# Patient Record
Sex: Female | Born: 1995 | Race: Black or African American | Hispanic: No | Marital: Single | Smoking: Current every day smoker
Health system: Southern US, Community
[De-identification: ages and names within clinical notes are randomized; demographics above are authoritative.]

## PROBLEM LIST (undated history)

## (undated) ENCOUNTER — Inpatient Hospital Stay (HOSPITAL_COMMUNITY): Payer: Self-pay

## (undated) DIAGNOSIS — A609 Anogenital herpesviral infection, unspecified: Secondary | ICD-10-CM

## (undated) DIAGNOSIS — J45909 Unspecified asthma, uncomplicated: Secondary | ICD-10-CM

## (undated) HISTORY — PX: WISDOM TOOTH EXTRACTION: SHX21

## (undated) HISTORY — DX: Anogenital herpesviral infection, unspecified: A60.9

---
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
## 2013-01-13 ENCOUNTER — Ambulatory Visit: Payer: Medicaid Other | Admitting: Family Medicine

## 2013-01-22 ENCOUNTER — Encounter: Payer: Self-pay | Admitting: *Deleted

## 2014-06-27 ENCOUNTER — Encounter (HOSPITAL_COMMUNITY): Payer: Self-pay

## 2014-08-26 NOTE — L&D Delivery Note (Cosign Needed)
Delivery Note  Patient was admitted for IOL due to alloimmunization. She was induced with cytotec and foley bulb. She had cervical change laboring down, so pitocin was not started. She was given an epidural and about 30 minutes later was feeling a lot of pressure. She was dilated to 9 cm with a bulging bag, so AROM (clear fluid) was performed at 0305. Following AROM, patient was complete with a lip. She labored down and began pushing around 0400.   At 4:15 AM a viable and healthy female was delivered via  (Presentation: Right occiput anterior).  APGAR: 8, 9; weight 8 lb 2.3 oz (3695 g).   Placenta status: intact, spontaneous.  Cord:  3 vessels with the following complications: None.  Cord pH: N/A  Anesthesia: Epidural  Episiotomy:  N/A Lacerations: Superficial left labial tear Suture Repair: N/A Est. Blood Loss (mL):  250  Mom to postpartum.  Baby to Couplet care / Skin to Skin.  Jamelle HaringHillary M Fitzgerald, MD [facility], PGY-1 06/29/2015, 4:59 AM   Patient is a G3P1011 at 2170w2d who was admitted for IOL due to alloimmunization, significant hx of anti D & C.  She progressed with augmentation via cytotec & AROM.  I was gloved and present for delivery in its entirety.  Second stage of labor progressed to SVD.  Complications: none  Lacerations: none   SHAW, KIMBERLY, CNM 7:30 AM  06/29/2015

## 2014-10-29 DIAGNOSIS — J4520 Mild intermittent asthma, uncomplicated: Secondary | ICD-10-CM

## 2014-10-29 HISTORY — DX: Mild intermittent asthma, uncomplicated: J45.20

## 2014-12-20 LAB — OB RESULTS CONSOLE GC/CHLAMYDIA
CHLAMYDIA, DNA PROBE: NEGATIVE
GC PROBE AMP, GENITAL: NEGATIVE

## 2014-12-20 LAB — OB RESULTS CONSOLE RUBELLA ANTIBODY, IGM: Rubella: IMMUNE

## 2014-12-20 LAB — OB RESULTS CONSOLE HEPATITIS B SURFACE ANTIGEN: HEP B S AG: NEGATIVE

## 2014-12-20 LAB — OB RESULTS CONSOLE HIV ANTIBODY (ROUTINE TESTING): HIV: NONREACTIVE

## 2014-12-20 LAB — OB RESULTS CONSOLE RPR: RPR: NONREACTIVE

## 2014-12-20 LAB — OB RESULTS CONSOLE ABO/RH: RH Type: NEGATIVE

## 2015-03-06 ENCOUNTER — Emergency Department (HOSPITAL_COMMUNITY): Payer: Medicaid - Out of State | Attending: Emergency Medicine

## 2015-03-06 ENCOUNTER — Encounter (HOSPITAL_COMMUNITY): Payer: Self-pay | Admitting: Obstetrics

## 2015-03-06 ENCOUNTER — Emergency Department (HOSPITAL_COMMUNITY)
Admission: EM | Admit: 2015-03-06 | Discharge: 2015-03-06 | Discharge status: Against Medical Advice | Payer: Medicaid - Out of State | Source: Home / Self Care | Attending: Emergency Medicine | Admitting: Emergency Medicine

## 2015-03-06 DIAGNOSIS — R1010 Upper abdominal pain, unspecified: Secondary | ICD-10-CM

## 2015-03-06 DIAGNOSIS — R110 Nausea: Secondary | ICD-10-CM

## 2015-03-06 DIAGNOSIS — Z79899 Other long term (current) drug therapy: Secondary | ICD-10-CM | POA: Insufficient documentation

## 2015-03-06 LAB — CBC WITH DIFFERENTIAL/PLATELET
BASOS ABS: 0 10*3/uL (ref 0.0–0.1)
BASOS PCT: 0 % (ref 0–1)
EOS PCT: 2 % (ref 0–5)
Eosinophils Absolute: 0.2 10*3/uL (ref 0.0–0.7)
HEMATOCRIT: 35.3 % — AB (ref 36.0–46.0)
Hemoglobin: 11.9 g/dL — ABNORMAL LOW (ref 12.0–15.0)
LYMPHS PCT: 13 % (ref 12–46)
Lymphs Abs: 1.6 10*3/uL (ref 0.7–4.0)
MCH: 31 pg (ref 26.0–34.0)
MCHC: 33.7 g/dL (ref 30.0–36.0)
MCV: 91.9 fL (ref 78.0–100.0)
Monocytes Absolute: 0.7 10*3/uL (ref 0.1–1.0)
Monocytes Relative: 6 % (ref 3–12)
NEUTROS ABS: 9.8 10*3/uL — AB (ref 1.7–7.7)
NEUTROS PCT: 79 % — AB (ref 43–77)
PLATELETS: 282 10*3/uL (ref 150–400)
RBC: 3.84 MIL/uL — AB (ref 3.87–5.11)
RDW: 13.4 % (ref 11.5–15.5)
WBC: 12.4 10*3/uL — AB (ref 4.0–10.5)

## 2015-03-06 LAB — URINALYSIS, ROUTINE W REFLEX MICROSCOPIC
Bilirubin Urine: NEGATIVE
Glucose, UA: NEGATIVE mg/dL
HGB URINE DIPSTICK: NEGATIVE
Ketones, ur: NEGATIVE mg/dL
LEUKOCYTES UA: NEGATIVE
NITRITE: NEGATIVE
PH: 6 (ref 5.0–8.0)
Protein, ur: NEGATIVE mg/dL
Specific Gravity, Urine: >1.03 — ABNORMAL HIGH (ref 1.005–1.030)
Urobilinogen, UA: 1 mg/dL (ref 0.0–1.0)

## 2015-03-06 LAB — COMPREHENSIVE METABOLIC PANEL
ALBUMIN: 3.3 g/dL — AB (ref 3.5–5.0)
ALT: 10 U/L — ABNORMAL LOW (ref 14–54)
ANION GAP: 6 (ref 5–15)
AST: 12 U/L — ABNORMAL LOW (ref 15–41)
Alkaline Phosphatase: 77 U/L (ref 38–126)
BUN: 7 mg/dL (ref 6–20)
CHLORIDE: 106 mmol/L (ref 101–111)
CO2: 25 mmol/L (ref 22–32)
Calcium: 8.7 mg/dL — ABNORMAL LOW (ref 8.9–10.3)
Creatinine, Ser: 0.59 mg/dL (ref 0.44–1.00)
Glucose, Bld: 89 mg/dL (ref 65–99)
POTASSIUM: 3.9 mmol/L (ref 3.5–5.1)
Sodium: 137 mmol/L (ref 135–145)
TOTAL PROTEIN: 6.6 g/dL (ref 6.5–8.1)
Total Bilirubin: 0.4 mg/dL (ref 0.3–1.2)

## 2015-03-06 LAB — LIPASE, BLOOD: LIPASE: 12 U/L — AB (ref 22–51)

## 2015-03-06 NOTE — Progress Notes (Signed)
OB rapid response RN receives call from [facility] with patient complaining of stabbing abdominal pain. RN assessing in ED states that pain does not seem to be related to uterus, states it seems to be mid upper abdominal pain. No vaginal bleeding or leaking of fluid. Feeling fetal movements. FHTs noted to be in 140s. EDD 07/04/15, with IUP at 22.6 weeks. Dr. Gladis RiffleAnywanu called with description of situation and FHTs noted and no further fetal monitoring needed. No additional orders by Dr. Gladis RiffleAnywanu at this time.

## 2015-03-06 NOTE — ED Provider Notes (Signed)
CSN: [id number]     Arrival date & time 03/06/15  1346 History   First MD Initiated Contact with Patient 03/06/15 1355     Chief Complaint  Patient presents with  . Abdominal Pain     HPI  Pt was seen at 1345.  Per pt, c/o gradual onset and resolution of one episode of upper abd "pain" that began PTA.  Was associated with nausea.  Describes the abd pain as "cramping."  Pt states she was in the ICU visiting her family, "finally got a chance to eat" and "just started to eat" when the discomfort occurred. Pt states she is starting to feel better since arrival to the ED. Denies vomiting/diarrhea, no fevers, no back pain, no rash, no CP/SOB, no black or blood in stools, no vaginal bleeding/discharge, no pelvic pain, no dysuria/hematuria. Pt EDC 22 6/7 weeks. States she had a "normal US" on 02/19/15 at her OB/GYN MD's office.    [facility] [place]. No past medical history on file.   No past surgical history on file.  History  Substance Use Topics  . Smoking status: Never Smoker   . Smokeless tobacco: Not on file  . Alcohol Use: No   OB History    Gravida Para Term Preterm AB TAB SAB Ectopic Multiple Living   1              Review of Systems ROS: Statement: All systems negative except as marked or noted in the HPI; Constitutional: Negative for fever and chills. ; ; Eyes: Negative for eye pain, redness and discharge. ; ; ENMT: Negative for ear pain, hoarseness, nasal congestion, sinus pressure and sore throat. ; ; Cardiovascular: Negative for chest pain, palpitations, diaphoresis, dyspnea and peripheral edema. ; ; Respiratory: Negative for cough, wheezing and stridor. ; ; Gastrointestinal: +nausea, upper abd pain. Negative for vomiting, diarrhea, blood in stool, hematemesis, jaundice and rectal bleeding. . ; ; Genitourinary: Negative for dysuria, flank pain and hematuria. ; ; GYN:  No vaginal bleeding, no vaginal discharge, no vulvar pain. ;; Musculoskeletal: Negative for back pain and  neck pain. Negative for swelling and trauma.; ; Skin: Negative for pruritus, rash, abrasions, blisters, bruising and skin lesion.; ; Neuro: Negative for headache, lightheadedness and neck stiffness. Negative for weakness, altered level of consciousness , altered mental status, extremity weakness, paresthesias, involuntary movement, seizure and syncope.      Allergies  Review of patient's allergies indicates no known allergies.  Home Medications   Prior to Admission medications   Medication Sig Start Date End Date Taking? Authorizing Provider  Prenatal Vit-Fe Fumarate-FA (PRENATAL MULTIVITAMIN) TABS tablet Take 1 tablet by mouth daily at 12 noon.   Yes Historical Provider, MD   There were no vitals taken for this visit. Physical Exam 1350: Physical examination:  Nursing notes reviewed; Vital signs and O2 SAT reviewed;  Constitutional: Well developed, Well nourished, Well hydrated, In no acute distress; Head:  Normocephalic, atraumatic; Eyes: EOMI, PERRL, No scleral icterus; ENMT: Mouth and pharynx normal, Mucous membranes moist; Neck: Supple, Full range of motion, No lymphadenopathy; Cardiovascular: Regular rate and rhythm, No gallop; Respiratory: Breath sounds clear & equal bilaterally, No wheezes.  Speaking full sentences with ease, Normal respiratory effort/excursion; Chest: Nontender, Movement normal; Abdomen: Soft, Nontender, Nondistended, Normal bowel sounds. +gravid.; Genitourinary: No CVA tenderness; Extremities: Pulses normal, No tenderness, No edema, No calf edema or asymmetry.; Neuro: AA&Ox3, Major CN grossly intact.  Speech clear. No gross focal motor or sensory deficits in extremities.;  Skin: Color normal, Warm, Dry.   ED Course  Procedures     EKG Interpretation None      MDM  MDM Reviewed: nursing note and vitals Interpretation: labs and ultrasound   Results for orders placed or performed during the hospital encounter of 03/06/15  Comprehensive metabolic panel   Result Value Ref Range   Sodium 137 135 - 145 mmol/L   Potassium 3.9 3.5 - 5.1 mmol/L   Chloride 106 101 - 111 mmol/L   CO2 25 22 - 32 mmol/L   Glucose, Bld 89 65 - 99 mg/dL   BUN 7 6 - 20 mg/dL   Creatinine, Ser 1.19 0.44 - 1.00 mg/dL   Calcium 8.7 (L) 8.9 - 10.3 mg/dL   Total Protein 6.6 6.5 - 8.1 g/dL   Albumin 3.3 (L) 3.5 - 5.0 g/dL   AST 12 (L) 15 - 41 U/L   ALT 10 (L) 14 - 54 U/L   Alkaline Phosphatase 77 38 - 126 U/L   Total Bilirubin 0.4 0.3 - 1.2 mg/dL   GFR calc non Af Amer >60 >60 mL/min   GFR calc Af Amer >60 >60 mL/min   Anion gap 6 5 - 15  Lipase, blood  Result Value Ref Range   Lipase 12 (L) 22 - 51 U/L  CBC with Differential  Result Value Ref Range   WBC 12.4 (H) 4.0 - 10.5 K/uL   RBC 3.84 (L) 3.87 - 5.11 MIL/uL   Hemoglobin 11.9 (L) 12.0 - 15.0 g/dL   HCT 14.7 (L) 82.9 - 56.2 %   MCV 91.9 78.0 - 100.0 fL   MCH 31.0 26.0 - 34.0 pg   MCHC 33.7 30.0 - 36.0 g/dL   RDW 13.0 86.5 - 78.4 %   Platelets 282 150 - 400 K/uL   Neutrophils Relative % 79 (H) 43 - 77 %   Neutro Abs 9.8 (H) 1.7 - 7.7 K/uL   Lymphocytes Relative 13 12 - 46 %   Lymphs Abs 1.6 0.7 - 4.0 K/uL   Monocytes Relative 6 3 - 12 %   Monocytes Absolute 0.7 0.1 - 1.0 K/uL   Eosinophils Relative 2 0 - 5 %   Eosinophils Absolute 0.2 0.0 - 0.7 K/uL   Basophils Relative 0 0 - 1 %   Basophils Absolute 0.0 0.0 - 0.1 K/uL   US Abdomen Complete 03/06/2015   CLINICAL DATA:  Upper abdominal pain.  Five months pregnant.  EXAM: ULTRASOUND ABDOMEN COMPLETE  COMPARISON:  None.  FINDINGS: Gallbladder: No gallstones or wall thickening visualized. No sonographic Murphy sign noted.  Common bile duct: Diameter: 2.2 mm, normal.  Liver: No focal lesion identified. Within normal limits in parenchymal echogenicity.  IVC: No abnormality visualized.  Pancreas: Head and body are normal.  Tail is obscured by bowel.  Spleen: Size and appearance within normal limits.  9.1 cm in length.  Right Kidney: Length: 11.2 cm.  Echogenicity within normal limits. No mass or hydronephrosis visualized.  Left Kidney: Length: 12.3 cm. Echogenicity within normal limits. No mass or hydronephrosis visualized.  Abdominal aorta: No aneurysm visualized.  1.6 cm maximal diameter.  Other findings: None.  IMPRESSION: Normal exam.   Electronically Signed   By: Francene Boyers M.D.   On: 03/06/2015 14:35    1350:  Pt immediately placed on toco/FHM on arrival to the ED. [facility] has viewed the strips:  FHR 140's, toco without contractions. Pt very certain her abd pain was located in her upper abd, not  her fundus/pelvis/etc area. Workup ordered (UA, labs, [place]). Pt agreeable. Currently denies complaints.  1545:  Pt walked out of the ED cursing and yelling "no one has even been in my room the whole time I was here!"    Samuel Jester, DO 03/09/15 1228

## 2015-03-06 NOTE — ED Notes (Signed)
Pt seen leaving department by staff.

## 2015-03-08 LAB — URINE CULTURE

## 2015-04-11 LAB — OB RESULTS CONSOLE PLATELET COUNT: PLATELETS: 276 10*3/uL

## 2015-04-11 LAB — OB RESULTS CONSOLE HGB/HCT, BLOOD
HCT: 33 %
HEMOGLOBIN: 11.1 g/dL

## 2015-04-11 LAB — OB RESULTS CONSOLE ANTIBODY SCREEN: Antibody Screen: POSITIVE

## 2015-06-26 ENCOUNTER — Encounter (HOSPITAL_COMMUNITY): Payer: Self-pay

## 2015-06-26 ENCOUNTER — Inpatient Hospital Stay (HOSPITAL_COMMUNITY)
Admission: AD | Admit: 2015-06-26 | Discharge: 2015-06-26 | Disposition: A | Discharge status: Home / Self Care | Payer: Medicaid Other | Source: Ambulatory Visit | Attending: Family Medicine | Admitting: Family Medicine

## 2015-06-26 LAB — URINALYSIS, ROUTINE W REFLEX MICROSCOPIC
BILIRUBIN URINE: NEGATIVE
Glucose, UA: NEGATIVE mg/dL
HGB URINE DIPSTICK: NEGATIVE
Ketones, ur: NEGATIVE mg/dL
Nitrite: NEGATIVE
PROTEIN: NEGATIVE mg/dL
Specific Gravity, Urine: 1.01 (ref 1.005–1.030)
UROBILINOGEN UA: 0.2 mg/dL (ref 0.0–1.0)
pH: 6.5 (ref 5.0–8.0)

## 2015-06-26 LAB — URINE MICROSCOPIC-ADD ON

## 2015-06-26 NOTE — MAU Note (Signed)
Pt c/o pain in belly button started 2 weeks ago that shoots down into vagina-cannot tell if it is a contraction. Denies vag bleeding or LOF. +FM. Got PNC in [place].

## 2015-06-27 ENCOUNTER — Other Ambulatory Visit: Payer: Self-pay | Admitting: *Deleted

## 2015-06-27 ENCOUNTER — Encounter: Payer: Self-pay | Admitting: *Deleted

## 2015-06-27 ENCOUNTER — Telehealth: Payer: Self-pay | Admitting: *Deleted

## 2015-06-27 DIAGNOSIS — O360190 Maternal care for anti-D [Rh] antibodies, unspecified trimester, not applicable or unspecified: Secondary | ICD-10-CM

## 2015-06-27 DIAGNOSIS — O0990 Supervision of high risk pregnancy, unspecified, unspecified trimester: Secondary | ICD-10-CM | POA: Insufficient documentation

## 2015-06-27 HISTORY — DX: Supervision of high risk pregnancy, unspecified, unspecified trimester: O09.90

## 2015-06-27 NOTE — Telephone Encounter (Signed)
Attempted to contact patient via phone.  Patient's mother answered stated patient's phone isn't on right now.  Explained I needed to speak to patient about her appointment for tomorrow.  Asked mother if she could have the patient call me back before 5 pm today.  Mother states that she can do that.  I gave her my direct office number so patient can call me directly.    Patient needs to be given appointment time for Ultrasound tomorrow 06/28/15 at 9:15 am in MFM.  Patient will then be induced due to gestational age of [redacted] wks and having anti-D antibody.  Clinic appointment for New OB visit for tomorrow has been cancelled.    Will await return call from patient.

## 2015-06-27 NOTE — Telephone Encounter (Signed)
Patient called after hours about a missed call made by AvalaKelly. I called her back to inform her that we needed to let her know about an appointment with MFM. Then [facility] [employer] Rassette [facility]NC[facility] spoke with her to inform her of delivery plan of action. Patient stated she understood, and was ok with the plan.

## 2015-06-28 ENCOUNTER — Encounter: Payer: Medicaid Other | Admitting: Advanced Practice Midwife

## 2015-06-28 ENCOUNTER — Encounter (HOSPITAL_COMMUNITY): Payer: Self-pay | Admitting: *Deleted

## 2015-06-28 ENCOUNTER — Inpatient Hospital Stay (HOSPITAL_COMMUNITY)
Admission: AD | Admit: 2015-06-28 | Discharge: 2015-07-01 | DRG: 775 | Disposition: A | Discharge status: Home / Self Care | Payer: Medicaid Other | Source: Ambulatory Visit | Attending: Obstetrics and Gynecology | Admitting: Obstetrics and Gynecology

## 2015-06-28 ENCOUNTER — Other Ambulatory Visit: Payer: Self-pay | Admitting: Obstetrics & Gynecology

## 2015-06-28 ENCOUNTER — Ambulatory Visit (HOSPITAL_COMMUNITY)
Admission: RE | Admit: 2015-06-28 | Discharge: 2015-06-28 | Disposition: A | Discharge status: Home / Self Care | Payer: Medicaid Other | Source: Ambulatory Visit | Attending: Obstetrics & Gynecology | Admitting: Obstetrics & Gynecology

## 2015-06-28 DIAGNOSIS — O360190 Maternal care for anti-D [Rh] antibodies, unspecified trimester, not applicable or unspecified: Secondary | ICD-10-CM | POA: Diagnosis present

## 2015-06-28 DIAGNOSIS — O360990 Maternal care for other rhesus isoimmunization, unspecified trimester, not applicable or unspecified: Secondary | ICD-10-CM | POA: Diagnosis present

## 2015-06-28 DIAGNOSIS — O360130 Maternal care for anti-D [Rh] antibodies, third trimester, not applicable or unspecified: Secondary | ICD-10-CM | POA: Diagnosis present

## 2015-06-28 DIAGNOSIS — Z6791 Unspecified blood type, Rh negative: Secondary | ICD-10-CM

## 2015-06-28 DIAGNOSIS — Z833 Family history of diabetes mellitus: Secondary | ICD-10-CM

## 2015-06-28 DIAGNOSIS — O360930 Maternal care for other rhesus isoimmunization, third trimester, not applicable or unspecified: Secondary | ICD-10-CM | POA: Diagnosis present

## 2015-06-28 DIAGNOSIS — Z3A39 39 weeks gestation of pregnancy: Secondary | ICD-10-CM | POA: Diagnosis not present

## 2015-06-28 DIAGNOSIS — O0990 Supervision of high risk pregnancy, unspecified, unspecified trimester: Secondary | ICD-10-CM

## 2015-06-28 HISTORY — DX: Unspecified blood type, rh negative: Z67.91

## 2015-06-28 LAB — RAPID URINE DRUG SCREEN, HOSP PERFORMED
Amphetamines: NOT DETECTED
Barbiturates: NOT DETECTED
Benzodiazepines: NOT DETECTED
Cocaine: NOT DETECTED
OPIATES: NOT DETECTED
Tetrahydrocannabinol: NOT DETECTED

## 2015-06-28 LAB — CBC
HCT: 33.4 % — ABNORMAL LOW (ref 36.0–46.0)
Hemoglobin: 11.4 g/dL — ABNORMAL LOW (ref 12.0–15.0)
MCH: 31.8 pg (ref 26.0–34.0)
MCHC: 34.1 g/dL (ref 30.0–36.0)
MCV: 93 fL (ref 78.0–100.0)
PLATELETS: 219 10*3/uL (ref 150–400)
RBC: 3.59 MIL/uL — AB (ref 3.87–5.11)
RDW: 14.3 % (ref 11.5–15.5)
WBC: 12.5 10*3/uL — ABNORMAL HIGH (ref 4.0–10.5)

## 2015-06-28 LAB — OB RESULTS CONSOLE GBS: STREP GROUP B AG: NEGATIVE

## 2015-06-28 LAB — GROUP B STREP BY PCR: GROUP B STREP BY PCR: NEGATIVE

## 2015-06-28 MED ORDER — ONDANSETRON HCL 4 MG/2ML IJ SOLN: 4.0000 mg | Freq: Four times a day (QID) | INTRAMUSCULAR | Status: DC | PRN

## 2015-06-28 MED ORDER — OXYCODONE-ACETAMINOPHEN 5-325 MG PO TABS: 1.0000 | ORAL_TABLET | ORAL | Status: DC | PRN

## 2015-06-28 MED ORDER — CITRIC ACID-SODIUM CITRATE 334-500 MG/5ML PO SOLN: 30.0000 mL | ORAL | Status: DC | PRN

## 2015-06-28 MED ORDER — TERBUTALINE SULFATE 1 MG/ML IJ SOLN
0.2500 mg | Freq: Once | INTRAMUSCULAR | Status: DC | PRN
  Filled 2015-06-28: qty 1

## 2015-06-28 MED ORDER — OXYTOCIN BOLUS FROM INFUSION: 500.0000 mL | INTRAVENOUS | Status: DC

## 2015-06-28 MED ORDER — LACTATED RINGERS IV SOLN: 500.0000 mL | INTRAVENOUS | Status: DC | PRN

## 2015-06-28 MED ORDER — FENTANYL 2.5 MCG/ML BUPIVACAINE 1/10 % EPIDURAL INFUSION (WH - ANES)
14.0000 mL/h | INTRAMUSCULAR | Status: DC | PRN
  Administered 2015-06-29: 14 mL/h via EPIDURAL
  Filled 2015-06-28: qty 125

## 2015-06-28 MED ORDER — EPHEDRINE 5 MG/ML INJ
10.0000 mg | INTRAVENOUS | Status: DC | PRN
  Filled 2015-06-28: qty 2

## 2015-06-28 MED ORDER — ACETAMINOPHEN 325 MG PO TABS
650.0000 mg | ORAL_TABLET | ORAL | Status: DC | PRN
  Filled 2015-06-28: qty 2

## 2015-06-28 MED ORDER — LIDOCAINE HCL (PF) 1 % IJ SOLN
30.0000 mL | INTRAMUSCULAR | Status: DC | PRN
  Filled 2015-06-28: qty 30

## 2015-06-28 MED ORDER — FENTANYL CITRATE (PF) 100 MCG/2ML IJ SOLN
100.0000 ug | INTRAMUSCULAR | Status: DC | PRN
  Administered 2015-06-28 (×2): 100 ug via INTRAVENOUS
  Filled 2015-06-28 (×2): qty 2

## 2015-06-28 MED ORDER — LACTATED RINGERS IV SOLN
INTRAVENOUS | Status: DC
  Administered 2015-06-28 – 2015-06-29 (×4): via INTRAVENOUS

## 2015-06-28 MED ORDER — OXYCODONE-ACETAMINOPHEN 5-325 MG PO TABS: 2.0000 | ORAL_TABLET | ORAL | Status: DC | PRN

## 2015-06-28 MED ORDER — MISOPROSTOL 25 MCG QUARTER TABLET
25.0000 ug | ORAL_TABLET | ORAL | Status: DC | PRN
  Administered 2015-06-28 (×2): 25 ug via VAGINAL
  Filled 2015-06-28: qty 0.25
  Filled 2015-06-28: qty 1
  Filled 2015-06-28: qty 0.25

## 2015-06-28 MED ORDER — DIPHENHYDRAMINE HCL 50 MG/ML IJ SOLN
12.5000 mg | INTRAMUSCULAR | Status: DC | PRN
Start: 2015-06-28 — End: 2015-06-29

## 2015-06-28 MED ORDER — PHENYLEPHRINE 40 MCG/ML (10ML) SYRINGE FOR IV PUSH (FOR BLOOD PRESSURE SUPPORT)
80.0000 ug | PREFILLED_SYRINGE | INTRAVENOUS | Status: DC | PRN
  Filled 2015-06-28: qty 20
  Filled 2015-06-28: qty 2

## 2015-06-28 MED ORDER — OXYTOCIN 40 UNITS IN LACTATED RINGERS INFUSION - SIMPLE MED
62.5000 mL/h | INTRAVENOUS | Status: DC
  Administered 2015-06-29 (×2): 62.5 mL/h via INTRAVENOUS
  Filled 2015-06-28: qty 1000

## 2015-06-28 NOTE — Progress Notes (Signed)
Labor Progress Note  Erin Tran is a 19 y.o. G3P1011 at 5243w1d  admitted for induction of labor due to alloimmunization.  S: Patient feeling her contractions almost constantly and now requesting something for pain. She would like to try just IV medications for now but may want an epidural when she is farther along.    O:  BP 126/66 mmHg  Pulse 95  Temp(Src) 98.2 F (36.8 C) (Oral)  Resp 20  Ht 5\' 6"  (1.676 m)  Wt 95.255 kg (210 lb)  BMI 33.91 kg/m2  LMP 10/04/2014    FHT:  FHR: 130 bpm, variability: moderate,  accelerations:  Present,  decelerations:  Absent UC:   regular, every 1-2 minutes SVE:   Dilation: 4 Effacement (%): 40, 50 Station: -2 Exam by:: Windell MomentFitzgerald, H., MD SROM/AROM: Intact   Labs: Lab Results  Component Value Date   WBC 12.5* 06/28/2015   HGB 11.4* 06/28/2015   HCT 33.4* 06/28/2015   MCV 93.0 06/28/2015   PLT 219 06/28/2015    Assessment / Plan: 19 y.o. G3P1011 3443w1d here for IOL for alloimmunization in early labor. She has made cervical change with vaginal cytotec x2.   Labor: Will allow patient to labor down given cervical change and frequency of contractions. May start pit 2x2 if progress slows.  Fetal Wellbeing:  Category I Pain Control:  Fentanyl Anticipated MOD:  NSVD  Expectant management  [name], MD [facility], PGY-1

## 2015-06-28 NOTE — Progress Notes (Signed)
Pt not in room at this time, currently walking.

## 2015-06-28 NOTE — H&P (Signed)
OBSTETRIC ADMISSION HISTORY AND PHYSICAL  Erin Tran is a 19 y.o. female G3P1011 with IUP at [redacted]w[redacted]d by 10wk presenting for IOL due to alloimmunization. She was previously induced for alloimmunization in her 2014 pregnancy. The induction lasted 36hrs. She was found to be antibody positive during her prenatal care for that pregnancy in 06/2012. At that time she was anti-D (1:64) and anti-C (1:4) positive. She has a history of TAB in 2012 but reported receiving Rhogam. She denies any blood transfusions.  She endorses fetal movement but denies contractions, vaginal bleeding, or loss of fluid. She denies headaches, blurry vision, shortness of breath, chest pain, midepigastric pain, RUQ pain, changes in urination, changes in stool, or peripheral edema.   She is unsure of whether she would like to breastfeed with this pregnancy. She breastfed her other son but said she felt he wasn't getting enough nutrition and switched to formula. She has WIC if she decides to proceed with formula feeding. She was previously on OCPs for birth control but says that they made her gain weight. She is unsure of what sort of birth control she is interested in using, but would like more information.  Dating: By 10 ---> Estimated Date of Delivery: 07/04/15  Sono:  , [redacted]w[redacted]d by LMP, gestational sac, yolk sac, and embryo visualized , [redacted]w[redacted]d by LMP, single IUP with cardiac motion , [redacted]w[redacted]d by LMP, normal anatomy, 380g - official read pending, per attending discussion was normal  Prenatal History/Complications:  Clinic  High Risk - transfer from [facility], [place]  Prenatal Labs  Dating  LMP Blood type: O/Negative/-- (04/26 0000)   Genetic Screen 1 Screen:  NT 1.7 mm  AFP:     Quad:     NIPS: Antibody:Positive (08/16 0000)  Anatomic US  wnl Rubella: Immune (04/26 0000)  GTT Early:               Third trimester: 87 RPR:     Flu vaccine  declined 12/20/14 HBsAg: Negative (04/26 0000)   TDaP vaccine                                                Rhogam:  04/11/15 HIV: Non-reactive (04/26 0000)   Baby Food                                               GBS: (For PCN allergy, check sensitivities)  Contraception  Pap:  Circumcision    Pediatrician    Support Person      Past Medical History: Past Medical History  Diagnosis Date  . Medical history non-contributory     Past Surgical History: Past Surgical History  Procedure Laterality Date  . No past surgeries      Obstetrical History: OB History    Gravida Para Term Preterm AB TAB SAB Ectopic Multiple Living   Social History: Social History   Social History  . Marital Status: Single    Spouse Name: N/A  . Number of Children: N/A  . Years of Education: N/A   Social History Main Topics  . Smoking status: Never Smoker   . Smokeless tobacco: Never Used  . Alcohol  Use: No  . Drug Use: No  . Sexual Activity: Not Currently    Birth Control/ Protection: None   Other Topics Concern  . None   Social History Narrative    Family History: Family History  Problem Relation Age of Onset  . Diabetes Maternal Grandmother     Allergies: No Known Allergies  Prescriptions prior to admission  Medication Sig Dispense Refill Last Dose  . ibuprofen (ADVIL,MOTRIN) 600 MG tablet Take 1 tablet (600 mg total) by mouth every 6 (six) hours. 30 tablet 0 Past Month at Unknown time  . Prenatal Vit-Fe Fumarate-FA (PRENATAL MULTIVITAMIN) TABS Take 1 tablet by mouth every morning.   06/28/2015 at Unknown time     Review of Systems   All systems reviewed and negative except as stated in HPI  Blood pressure 123/50, pulse 90, temperature 98.3 F (36.8 C), temperature source Oral, resp. rate 17, height 5\' 6"  (1.676 m), weight 210 lb (95.255 kg), last menstrual period 10/04/2014, unknown if currently breastfeeding. General appearance: alert, cooperative and appears stated age. Makes good eye contact.  Lungs: clear to  auscultation bilaterally Heart: regular rate and rhythm Abdomen: soft, non-tender; bowel sounds normal Pelvic: adequate Extremities: Homans sign is negative, no sign of DVT DTR's 2+, wnl Presentation: cephalic Fetal monitoringBaseline: 140 bpm, Variability: Good {> 6 bpm), Accelerations: Reactive and Decelerations: Absent Uterine activityFrequency: Every 5-7 minutes    Dilation: Fingertip Effacement (%): Thick Station: -3 Exam by:: Rolan BuccoA. Harris, RN   Prenatal labs: ABO, Rh: O/Negative/-- (04/26 0000) Antibody: Positive (08/16 0000) Rubella: !Error! RPR: Nonreactive (04/26 0000)  HBsAg: Negative (04/26 0000)  HIV: Non-reactive (04/26 0000)  GBS:    1 hr Glucola 87 Genetic screening  NT wnl Anatomy US wnl  Prenatal Transfer Tool  Maternal Diabetes: No Genetic Screening: Normal Maternal Ultrasounds/Referrals: Normal Fetal Ultrasounds or other Referrals:  None Maternal Substance Abuse:  No Significant Maternal Medications:  None Significant Maternal Lab Results: Lab values include: Rh negative- From 04/12/2015 Coomb's titer 64 (HH) and Anti D 32 (HH), Anti-C pos. Received rhogam on 12/12/2014  No results found for this or any previous visit (from the past 24 hour(s)).  Patient Active Problem List   Diagnosis Date Noted  . Rh alloimmunization, maternal, antepartum 06/28/2015  . Supervision of high risk pregnancy, antepartum 06/27/2015  . Anti-D antibodies present during pregnancy 06/27/2015   Assessment: Erin Tran is a 19 y.o. G3P1011 at 4061w1d here for IOL due to alloimmunization with Rh-D and Rh-C antibodies.  #Labor: Induction. Cytotec now and q4hrs, foley bulb when dilated enough for placement. Pitocin when favorable (Bishop>8) #FWB: Category 1, reactive #Pain: IV medications PRN.  #MOF: breast/bottle/unsure #MOC: unsure. Had lengthy discussion with patient and provided handout re: options # Circumcision: yes, outpatient due to Medicaid. Will provided list of  practices at discharge.   Isa RankinKimberly Niles [facility] 06/28/2015, 12:05 PM

## 2015-06-29 ENCOUNTER — Inpatient Hospital Stay (HOSPITAL_COMMUNITY): Payer: Medicaid Other | Admitting: Anesthesiology

## 2015-06-29 ENCOUNTER — Encounter (HOSPITAL_COMMUNITY): Payer: Self-pay | Admitting: *Deleted

## 2015-06-29 LAB — RPR: RPR: NONREACTIVE

## 2015-06-29 MED ORDER — SENNOSIDES-DOCUSATE SODIUM 8.6-50 MG PO TABS
2.0000 | ORAL_TABLET | ORAL | Status: DC
  Administered 2015-06-30 (×2): 2 via ORAL
  Filled 2015-06-29 (×2): qty 2

## 2015-06-29 MED ORDER — WITCH HAZEL-GLYCERIN EX PADS: 1.0000 "application " | MEDICATED_PAD | CUTANEOUS | Status: DC | PRN

## 2015-06-29 MED ORDER — TETANUS-DIPHTH-ACELL PERTUSSIS 5-2.5-18.5 LF-MCG/0.5 IM SUSP
0.5000 mL | Freq: Once | INTRAMUSCULAR | Status: AC
  Administered 2015-06-30: 0.5 mL via INTRAMUSCULAR
  Filled 2015-06-29: qty 0.5

## 2015-06-29 MED ORDER — ONDANSETRON HCL 4 MG/2ML IJ SOLN: 4.0000 mg | INTRAMUSCULAR | Status: DC | PRN

## 2015-06-29 MED ORDER — OXYCODONE-ACETAMINOPHEN 5-325 MG PO TABS: 1.0000 | ORAL_TABLET | ORAL | Status: DC | PRN

## 2015-06-29 MED ORDER — IBUPROFEN 600 MG PO TABS
600.0000 mg | ORAL_TABLET | Freq: Four times a day (QID) | ORAL | Status: DC
  Administered 2015-06-29 – 2015-07-01 (×9): 600 mg via ORAL
  Filled 2015-06-29 (×10): qty 1

## 2015-06-29 MED ORDER — LANOLIN HYDROUS EX OINT: TOPICAL_OINTMENT | CUTANEOUS | Status: DC | PRN

## 2015-06-29 MED ORDER — PRENATAL MULTIVITAMIN CH
1.0000 | ORAL_TABLET | Freq: Every day | ORAL | Status: DC
  Administered 2015-06-29 – 2015-06-30 (×2): 1 via ORAL
  Filled 2015-06-29 (×3): qty 1

## 2015-06-29 MED ORDER — VITAMIN K1 1 MG/0.5ML IJ SOLN
INTRAMUSCULAR | Status: AC
  Filled 2015-06-29: qty 0.5

## 2015-06-29 MED ORDER — DIPHENHYDRAMINE HCL 25 MG PO CAPS: 25.0000 mg | ORAL_CAPSULE | Freq: Four times a day (QID) | ORAL | Status: DC | PRN

## 2015-06-29 MED ORDER — SIMETHICONE 80 MG PO CHEW: 80.0000 mg | CHEWABLE_TABLET | ORAL | Status: DC | PRN

## 2015-06-29 MED ORDER — LIDOCAINE HCL (PF) 1 % IJ SOLN
INTRAMUSCULAR | Status: DC | PRN
  Administered 2015-06-29 (×2): 5 mL

## 2015-06-29 MED ORDER — ZOLPIDEM TARTRATE 5 MG PO TABS: 5.0000 mg | ORAL_TABLET | Freq: Every evening | ORAL | Status: DC | PRN

## 2015-06-29 MED ORDER — ONDANSETRON HCL 4 MG PO TABS: 4.0000 mg | ORAL_TABLET | ORAL | Status: DC | PRN

## 2015-06-29 MED ORDER — BENZOCAINE-MENTHOL 20-0.5 % EX AERO: 1.0000 "application " | INHALATION_SPRAY | CUTANEOUS | Status: DC | PRN

## 2015-06-29 MED ORDER — ACETAMINOPHEN 325 MG PO TABS
650.0000 mg | ORAL_TABLET | ORAL | Status: DC | PRN
  Administered 2015-06-29: 650 mg via ORAL

## 2015-06-29 MED ORDER — DIBUCAINE 1 % RE OINT: 1.0000 "application " | TOPICAL_OINTMENT | RECTAL | Status: DC | PRN

## 2015-06-29 MED ORDER — OXYCODONE-ACETAMINOPHEN 5-325 MG PO TABS: 2.0000 | ORAL_TABLET | ORAL | Status: DC | PRN

## 2015-06-29 MED ORDER — INFLUENZA VAC SPLIT QUAD 0.5 ML IM SUSY
0.5000 mL | PREFILLED_SYRINGE | INTRAMUSCULAR | Status: AC
  Administered 2015-06-30: 0.5 mL via INTRAMUSCULAR
  Filled 2015-06-29: qty 0.5

## 2015-06-29 NOTE — Lactation Note (Signed)
This note was copied from the chart of Erin Tran [place]. Lactation Consultation Note  Patient Name: Erin Tran Perren ZOXWR'UToday's Date: 06/29/2015 Reason for consult: Follow-up assessment (uncg STUDENT ALSO AT BS WITH LC )  BABY IS 10 HOURS OLD AND HAS BEEN TO THE BREAST SEVERAL TIMES.  1ST VISIT AT 1230 - PER MOM BABY HAD RECENTLY FED AND WAS SOUND ASLEEP.  2ND VISIT BABY MORE AWAKE AND HAD RECENTLY HAD A BATH.  LC ASSISTED MOM WITH POSITIONING AND OBTAINING DEPTH IN A FOOTBALL POSITION.  BABY SLUGGISH FEEDING PATTERN, FEW SWALLOWS NOTED , INCREASED WITH BREAST COMPRESSIONS.  BABY FED 10 MINS AND BECAME NON - NUTRITIVE SO MOM RELEASE LATCH. NIPPLE APPEARED NORMAL WHEN  BABY  RELEASED. PRIOR TO LATCH REVIEWED HAND EXPRESSING WITH MOM , SMALL DROPS OF COLOSTRUM NOTED  AND MOM REPEATED DEMO AFTER FEEDING AND USED EBM ON HER NIPPLE. BABY SKIN TO SKIN WITH MOM. Mother informed of post-discharge support and given phone number to the lactation department, including services for phone call assistance;  out-patient appointments; and breastfeeding support group. List of other breastfeeding resources in the community given in the handout.  Encouraged mother to call for problems or concerns related to breastfeeding.    Maternal Data    Feeding Feeding Type: Breast Fed Length of feed: 10 min (swalows noted , sluggish pattern )  LATCH Score/Interventions Latch: Grasps breast easily, tongue down, lips flanged, rhythmical sucking.  Audible Swallowing: A few with stimulation Intervention(s): Skin to skin;Alternate breast massage  Type of Nipple: Everted at rest and after stimulation  Comfort (Breast/Nipple): Soft / non-tender     Hold (Positioning): Assistance needed to correctly position infant at breast and maintain latch. Intervention(s): Breastfeeding basics reviewed;Support Pillows;Skin to skin  LATCH Score: 8  Lactation Tools Discussed/Used WIC Program: Yes (PER MOM )   Consult  Status Consult Status: Follow-up Date: 06/30/15 Follow-up type: In-patient    Kathrin Greathouseorio, Staria Birkhead Ann 06/29/2015, 2:32 PM

## 2015-06-29 NOTE — Anesthesia Postprocedure Evaluation (Signed)
  Anesthesia Post-op Note  Patient: Erin Tran  Procedure(s) Performed: * No procedures listed *  Patient Location: PACU and Mother/Baby  Anesthesia Type:Epidural  Level of Consciousness: awake, alert  and oriented  Airway and Oxygen Therapy: Patient Spontanous Breathing  Post-op Pain: none  Post-op Assessment: Post-op Vital signs reviewed, Patient's Cardiovascular Status Stable, No headache, No backache, No residual numbness and No residual motor weakness  Post-op Vital Signs: Reviewed and stable  Complications: No apparent anesthesia complications

## 2015-06-29 NOTE — Anesthesia Preprocedure Evaluation (Signed)

## 2015-06-29 NOTE — Anesthesia Procedure Notes (Signed)
Epidural Patient location during procedure: OB  Staffing Anesthesiologist: Kimberley Speece Performed by: anesthesiologist   Preanesthetic Checklist Completed: patient identified, site marked, surgical consent, pre-op evaluation, timeout performed, IV checked, risks and benefits discussed and monitors and equipment checked  Epidural Patient position: sitting Prep: DuraPrep Patient monitoring: heart rate, continuous pulse ox and blood pressure Approach: right paramedian Location: L3-L4 Injection technique: LOR saline  Needle:  Needle type: Tuohy  Needle gauge: 17 G Needle length: 9 cm and 9 Needle insertion depth: 6 cm Catheter type: closed end flexible Catheter size: 20 Guage Catheter at skin depth: 11 cm Test dose: negative  Assessment Events: blood not aspirated, injection not painful, no injection resistance, negative IV test and no paresthesia  Additional Notes Patient identified. Risks/Benefits/Options discussed with patient including but not limited to bleeding, infection, nerve damage, paralysis, failed block, incomplete pain control, headache, blood pressure changes, nausea, vomiting, reactions to medication both or allergic, itching and postpartum back pain. Confirmed with bedside nurse the patient's most recent platelet count. Confirmed with patient that they are not currently taking any anticoagulation, have any bleeding history or any family history of bleeding disorders. Patient expressed understanding and wished to proceed. All questions were answered. Sterile technique was used throughout the entire procedure. Please see nursing notes for vital signs. Test dose was given through epidural needle and negative prior to continuing to dose epidural or start infusion. Warning signs of high block given to the patient including shortness of breath, tingling/numbness in hands, complete motor block, or any concerning symptoms with instructions to call for help. Patient was given  instructions on fall risk and not to get out of bed. All questions and concerns addressed with instructions to call with any issues.   

## 2015-06-29 NOTE — Progress Notes (Signed)
Labor Progress Note  Erin CopasDiamon Roxan Tran is a 19 y.o. G3P1011 at 4431w2d  admitted for induction of labor due to alloimmunization.  S: Patient now with epidural. She can still feel contractions as pressure in her lower abdomen.    O:  BP 109/57 mmHg  Pulse 100  Temp(Src) 98.2 F (36.8 C) (Oral)  Resp 20  Ht 5\' 6"  (1.676 m)  Wt 95.255 kg (210 lb)  BMI 33.91 kg/m2  SpO2 97%  LMP 10/04/2014     FHT:  FHR: 130 bpm, variability: moderate,  accelerations:  Present,  decelerations:  Present Early  UC:   regular, every 2-3 minutes SVE:   Dilation: Lip/rim Effacement (%): 90 Station: -1, 0 Exam by:: Erin Tran,Erin Tran, Erin Tran AROM: clear fluid @ 0305  Labs: Lab Results  Component Value Date   WBC 12.5* 06/28/2015   HGB 11.4* 06/28/2015   HCT 33.4* 06/28/2015   MCV 93.0 06/28/2015   PLT 219 06/28/2015    Assessment / Plan: 19 y.o. G3P1011 5831w2d in active labor Induction of labor due to alloimmunization,  progressing well after cytotec x2 and laboring down.  Labor: Progressing normally Fetal Wellbeing:  Category I Pain Control:  Epidural Anticipated MOD:  NSVD  Expectant management  Erin GobbleHillary Fitzgerald, Erin Tran [facility], PGY-1

## 2015-06-29 NOTE — Progress Notes (Signed)
UR chart review completed.  

## 2015-06-30 NOTE — Progress Notes (Signed)
No Rhogam per Lenny PastelKristan Westfall ME, due to weak D status

## 2015-06-30 NOTE — Progress Notes (Signed)
Post Partum Day 1 Subjective:  Erin Tran is a 19 y.o. [id number] 2866w2d s/p IOL for alloimmunization with Rh-D and Rh-C antibodies followed by SVD. No acute events overnight.  Pt denies problems with ambulating, voiding or po intake. She denies nausea or vomiting.  Pain is well controlled.  She has had flatus.  Lochia is Minimal.  Plan for birth control is IUD.  Method of feeding is breast. She would prefer outpatient circumcision for her baby boy.  Objective: Blood pressure 115/57, pulse 60, temperature 97.9 F (36.6 C), temperature source Oral, resp. rate 18, height 5\' 6"  (1.676 m), weight 95.255 kg (210 lb), last menstrual period 10/04/2014, SpO2 100 %, unknown if currently breastfeeding.  Physical Exam:  General: alert, cooperative and no acute distress Lochia: normal flow Chest: normal WOB, clear to auscultation bilaterally Heart: regular rate and rhythm, normal S1S2 Abdomen: +BS, soft, mild TTP (appropriate) Uterine Fundus: firm, located below level of umbilicus Extremities: no pitting edema or calf tenderness  Recent Labs  06/28/15 1210  HGB 11.4*  HCT 33.4*    Assessment/Plan:  Erin Tran is a 19 y.o. [id number] 8466w2d s/p IOL for alloimmunization with Rh-D and Rh-C antibodies, post-partum day 1.  #Alloimmunization: Continue to monitor for signs/symptoms of anemia. H/H before delivery was stable. #Postpartum care: Continue routine postpartum care. History and physical exam are reassuring that patient is okay for discharge when she feels ready. #Breastfeeding: Lactation support PRN. #Contraception: Mirena IUD at 6wk postpartum visit.   LOS: 2 days   Gabriel RungKristen Westfall 06/30/2015, 7:35 AM   I have seen and examined this patient and agree the above assessment.  Respiratory effort normal, lochia appropriate, legs negative,  pain level normal. Separate DC summary written  CRESENZO-DISHMAN,[name] 07/04/2015 11:11 AM

## 2015-06-30 NOTE — Lactation Note (Signed)
This note was copied from the chart of Erin Tran. Lactation Consultation Note  Patient Name: Erin Tran [id number]'XToday's Date: 06/30/2015 Reason for consult: Follow-up assessment Mostly bf mom that reports bf is going well. Mom has supplemented because she is worried about the baby being jaundice. She thinks that if she can bf and give formula it will go away more quickly. She reports baby is latching well, she has a good supply, and has no nipple/breast pain at this time. Encouraged mom to continue bf on demand. She is aware of O/P lactation and support group. She will call as needed for bf help.   Maternal Data    Feeding Feeding Type: Breast Fed  LATCH Score/Interventions Latch: Grasps breast easily, tongue down, lips flanged, rhythmical sucking. (pulled down lips/ more br tissue in mouth)  Audible Swallowing: A few with stimulation Intervention(s): Skin to skin  Type of Nipple: Flat (pulls nipple out)  Comfort (Breast/Nipple): Soft / non-tender     Hold (Positioning): No assistance needed to correctly position infant at breast.  LATCH Score: 8  Lactation Tools Discussed/Used     Consult Status Consult Status: Follow-up Date: 07/01/15 Follow-up type: In-patient    Erin Tran 06/30/2015, 9:48 PM

## 2015-07-01 LAB — TYPE AND SCREEN
ABO/RH(D): O NEG
ANTIBODY SCREEN: POSITIVE
DAT, IgG: NEGATIVE
DONOR AG TYPE: NEGATIVE
Donor AG Type: NEGATIVE
PT AG TYPE: NEGATIVE
UNIT DIVISION: 0
Unit division: 0

## 2015-07-01 LAB — RH IG WORKUP (INCLUDES ABO/RH)
ABO/RH(D): O NEG
FETAL SCREEN: NEGATIVE
Gestational Age(Wks): 39.2
UNIT DIVISION: 0

## 2015-07-01 NOTE — Discharge Instructions (Signed)

## 2015-07-01 NOTE — Discharge Summary (Signed)
OB Discharge Summary     Patient Name: Erin Tran DOB: 09-25-1995 MRN: [id number]  Date of admission: 06/28/2015 Delivering MD: Casey Burkitt   Date of discharge: 07/01/2015  Admitting diagnosis: INDUCTION Intrauterine pregnancy: [redacted]w[redacted]d     Secondary diagnosis:  Principal Problem:   Rh alloimmunization, maternal, antepartum Active Problems:   Supervision of high risk pregnancy, antepartum   Anti-D antibodies present during pregnancy   Blood type, Rh negative  Additional problems: none     Discharge diagnosis: Term Pregnancy Delivered                                                                                                Post partum procedures:none  Augmentation: AROM and Cytotec  Complications: None  Hospital course:  Induction of Labor With Vaginal Delivery   19 y.o. yo [id number] at [redacted]w[redacted]d was admitted to the hospital 06/28/2015 for induction of labor.  Indication for induction: Alloimmunization.  Patient had an uncomplicated labor course as follows: Membrane Rupture Time/Date: 3:06 AM ,06/29/2015   Intrapartum Procedures: Episiotomy: None [1]                                         Lacerations:  None [1]  Patient had delivery of a Viable infant.  Information for the patient's newborn:  Rayli, Wiederhold [[id number]]  Delivery Method: Vag-Spont   06/29/2015  Details of delivery can be found in separate delivery note.  Patient had a routine postpartum course. Patient is discharged home 07/01/2015 11:41 AM    Physical exam  Filed Vitals:   06/29/15 1130 06/29/15 1839 06/30/15 0556 06/30/15 1817  BP: 119/50 114/58 115/57 123/64  Pulse: 86 79 60 71  Temp: 98.2 F (36.8 C) 98.3 F (36.8 C) 97.9 F (36.6 C) 98.1 F (36.7 C)  TempSrc: Oral Oral Oral Oral  Resp: Height:      Weight:      SpO2: 100%      General: alert and cooperative Lochia: appropriate Uterine Fundus: firm Incision: N/A DVT Evaluation: No evidence of DVT seen on  physical exam. Labs: Lab Results  Component Value Date   WBC 12.5* 06/28/2015   HGB 11.4* 06/28/2015   HCT 33.4* 06/28/2015   MCV 93.0 06/28/2015   PLT 219 06/28/2015   No flowsheet data found.  Discharge instruction: per After Visit Summary and "Baby and Me Booklet".  Medications:MEDICATIONS: See list below After visit meds:    Medication List    TAKE these medications        ibuprofen 600 MG tablet  Commonly known as:  ADVIL,MOTRIN  Take 1 tablet (600 mg total) by mouth every 6 (six) hours.     prenatal multivitamin Tabs tablet  Take 1 tablet by mouth every morning.        Diet: routine diet  Activity: Advance as tolerated. Pelvic rest for 6 weeks.   Outpatient follow up:6 weeks Follow up Appt:Future Appointments Date Time Provider Department Center  08/09/2015 12:45 PM Catalina AntiguaPeggy Constant, MD [facility]   Follow up Visit:No Follow-up on file.  Postpartum contraception: IUD Mirena  Newborn Data: Live born female  Birth Weight: 8 lb 2.3 oz (3695 g) APGAR: 8, 9  Baby Feeding: Breast Disposition:home with mother   07/01/2015 Cam HaiSHAW, KIMBERLY, CNM  07/01/2015

## 2015-07-01 NOTE — Lactation Note (Signed)
This note was copied from the chart of Erin Sheliah HatchDiamon Delia. Lactation Consultation Note; Mom reports baby fed about 1 hour ago and had formula afterwards. Reports breasts are feeling a little fuller this morning. Reports no pain with latch. No questions at present. To call prn  Patient Name: Erin Tran ZOXWR'UToday's Date: 07/01/2015 Reason for consult: Follow-up assessment   Maternal Data Formula Feeding for Exclusion: Yes Reason for exclusion: Mother's choice to formula and breast feed on admission  Feeding   LATCH Score/Interventions                      Lactation Tools Discussed/Used     Consult Status Consult Status: Complete    Pamelia HoitWeeks, Suda Forbess D 07/01/2015, 9:52 AM

## 2015-08-09 ENCOUNTER — Ambulatory Visit: Payer: Medicaid Other | Admitting: Obstetrics and Gynecology

## 2016-01-09 ENCOUNTER — Encounter (HOSPITAL_COMMUNITY): Payer: Self-pay | Admitting: *Deleted

## 2016-05-27 ENCOUNTER — Encounter (HOSPITAL_COMMUNITY): Payer: Self-pay | Admitting: *Deleted

## 2016-06-26 ENCOUNTER — Ambulatory Visit: Payer: Medicaid Other | Admitting: Obstetrics and Gynecology

## 2016-08-26 NOTE — L&D Delivery Note (Signed)
Patient is 21 y.o. [id number] [redacted]w[redacted]d admitted PPROM. S/p IOL with foley bulb, cytotec, followed by Pitocin. Prenatal course also complicated by minimal prenatal care and Rh negative with anti D and Antii C antibody.  Delivery Note At 11:14 AM a viable female was delivered via  (Presentation: cephalic;LOA).  APGAR: 7,9 ; weight pending .   Placenta status: intact,spontaneous .  Cord: 3 vessel    Anesthesia:  epidural Episiotomy:  none Lacerations:  none Suture Repair: n/a Est. Blood Loss (mL):  300  Mom to postpartum.  Baby to Couplet care / Skin to Skin.   Upon arrival, patient was complete. She pushed with good maternal effort to deliver a viable female infant in cephalic, LOA position over intact perineum. No nuchal cord present.Anterior shoulder delivered easily. Baby was noted to have good tone and place on maternal abdomen for oral suctioning, drying and stimulation. Delayed cord clamping performed. Placenta delivered spontaneously with gentle cord traction. Fundus firm with massage and Pitocin. Perineum inspected and found to have no laceration.. Counts of sharps, instruments, and lap pads were all correct.   Rolm Bookbinder, DO [place] [facility]

## 2017-01-29 ENCOUNTER — Ambulatory Visit (INDEPENDENT_AMBULATORY_CARE_PROVIDER_SITE_OTHER): Payer: Medicaid Other | Admitting: *Deleted

## 2017-01-29 ENCOUNTER — Encounter: Payer: Self-pay | Admitting: Obstetrics and Gynecology

## 2017-01-29 DIAGNOSIS — Z3201 Encounter for pregnancy test, result positive: Secondary | ICD-10-CM

## 2017-01-29 DIAGNOSIS — Z3200 Encounter for pregnancy test, result unknown: Secondary | ICD-10-CM

## 2017-01-29 LAB — POCT PREGNANCY, URINE: Preg Test, Ur: POSITIVE — AB

## 2017-01-29 NOTE — Progress Notes (Signed)
Pt informed of +UPT today.  LMP 11/07/16.  EDD 08/14/17.  Pt desires prenatal care in this office. She had gone to [facility], had US and was told she was 15w 2d at that time. That would make EDD 06/26/17 and today she would be 18w 6d. US for anatomy and EGA scheduled on 6/15 @ 1545.

## 2017-01-31 ENCOUNTER — Encounter (HOSPITAL_COMMUNITY): Payer: Self-pay | Admitting: Family Medicine

## 2017-02-07 ENCOUNTER — Ambulatory Visit (HOSPITAL_COMMUNITY)
Admission: RE | Admit: 2017-02-07 | Discharge: 2017-02-07 | Disposition: A | Discharge status: Home / Self Care | Payer: Medicaid Other | Source: Ambulatory Visit | Attending: Family Medicine | Admitting: Family Medicine

## 2017-02-07 ENCOUNTER — Other Ambulatory Visit: Payer: Self-pay | Admitting: Family Medicine

## 2017-02-07 DIAGNOSIS — Z3689 Encounter for other specified antenatal screening: Secondary | ICD-10-CM

## 2017-02-07 DIAGNOSIS — Z3A19 19 weeks gestation of pregnancy: Secondary | ICD-10-CM | POA: Insufficient documentation

## 2017-02-07 DIAGNOSIS — Z3200 Encounter for pregnancy test, result unknown: Secondary | ICD-10-CM

## 2017-02-07 DIAGNOSIS — Z363 Encounter for antenatal screening for malformations: Secondary | ICD-10-CM | POA: Diagnosis not present

## 2017-02-24 ENCOUNTER — Encounter: Payer: Self-pay | Admitting: Family Medicine

## 2017-02-24 NOTE — Progress Notes (Signed)
Patient did not keep her appointment today. She may reschedule at her leisure.

## 2017-03-13 ENCOUNTER — Encounter: Payer: Self-pay | Admitting: Advanced Practice Midwife

## 2017-05-07 ENCOUNTER — Encounter: Payer: Self-pay | Admitting: Obstetrics and Gynecology

## 2017-05-08 ENCOUNTER — Encounter (HOSPITAL_COMMUNITY): Payer: Self-pay

## 2017-05-08 ENCOUNTER — Emergency Department (HOSPITAL_COMMUNITY)
Admission: EM | Admit: 2017-05-08 | Discharge: 2017-05-08 | Disposition: A | Discharge status: Home / Self Care | Payer: Medicaid Other | Attending: Emergency Medicine | Admitting: Emergency Medicine

## 2017-05-08 DIAGNOSIS — H9202 Otalgia, left ear: Secondary | ICD-10-CM | POA: Diagnosis not present

## 2017-05-08 DIAGNOSIS — R0981 Nasal congestion: Secondary | ICD-10-CM | POA: Diagnosis present

## 2017-05-08 DIAGNOSIS — F17210 Nicotine dependence, cigarettes, uncomplicated: Secondary | ICD-10-CM | POA: Diagnosis not present

## 2017-05-08 MED ORDER — FLUTICASONE PROPIONATE 50 MCG/ACT NA SUSP: 1.0000 | Freq: Every day | NASAL | 0 refills | Status: DC

## 2017-05-08 NOTE — ED Provider Notes (Signed)
[facility]EMERGENCY DEPT Provider Note   CSN: [id number] Arrival date & time: 05/08/17  1039     History   Chief Complaint No chief complaint on file.   HPI Erin Tran is a 21 y.o. female presenting with L ear pain x7 days.   Pt states for the last week, she has had L ear pain. She has associated nasal congestion and intermittent nonproductive cough. She denies hearing loss/changes or tinnitus. She denies fevers, chills, sinus pressure, sore throat, chest pain, shortness of breath, nausea, vomiting or abd pain. She is 7 months pregnant, and is not considered high risk. She has not tried anything for her ear pain.   HPI  Past Medical History:  Diagnosis Date  . Medical history non-contributory   . Pregnant     Patient Active Problem List   Diagnosis Date Noted  . Rh alloimmunization, maternal, antepartum 06/28/2015  . Blood type, Rh negative 06/28/2015  . Supervision of high risk pregnancy, antepartum 07-09-2015  . Anti-D antibodies present during pregnancy Jul 09, 2015    Past Surgical History:  Procedure Laterality Date  . NO PAST SURGERIES      OB History    Gravida Para Term Preterm AB Living   0 1 2   SAB TAB Ectopic Multiple Live Births   0 0 0   2       Home Medications    Prior to Admission medications   Medication Sig Start Date End Date Taking? Authorizing Provider  fluticasone (FLONASE) 50 MCG/ACT nasal spray Place 1 spray into both nostrils daily. 05/08/17   Zainab Crumrine, PA-C  ibuprofen (ADVIL,MOTRIN) 600 MG tablet Take 1 tablet (600 mg total) by mouth every 6 (six) hours. Patient not taking: Reported on 01/29/2017 12/09/12   Marlis Edelson, CNM  Prenatal Vit-Fe Fumarate-FA (PRENATAL MULTIVITAMIN) TABS tablet Take 1 tablet by mouth daily at 12 noon.    [provider]  Prenatal Vit-Fe Fumarate-FA (PRENATAL MULTIVITAMIN) TABS Take 1 tablet by mouth every morning.    [provider]    Family History Family History    Problem Relation Age of Onset  . Diabetes Maternal Grandmother   . Cancer Neg Hx   . Hypertension Neg Hx   . Hyperlipidemia Neg Hx   . Seizures Mother   . Stroke Neg Hx     Social History Social History  Substance Use Topics  . Smoking status: Current Every Day Smoker    Types: Cigarettes  . Smokeless tobacco: Never Used  . Alcohol use No     Allergies   Patient has no known allergies.   Review of Systems Review of Systems  Constitutional: Negative for chills and fever.  HENT: Positive for congestion. Negative for ear discharge, ear pain, hearing loss, sinus pain, sinus pressure, sore throat, tinnitus and trouble swallowing.   Eyes: Negative for pain and discharge.  Respiratory: Positive for cough. Negative for chest tightness, shortness of breath and wheezing.   Cardiovascular: Negative for chest pain.  Gastrointestinal: Negative for abdominal pain, nausea and vomiting.     Physical Exam Updated Vital Signs BP 124/80 (BP Location: Right Arm)   Pulse (!) 104   Temp 98.5 F (36.9 C) (Oral)   Resp 17   LMP 09/11/2016   SpO2 98%   Physical Exam  Constitutional: She is oriented to person, place, and time. She appears well-developed and well-nourished. No distress.  HENT:  Head: Normocephalic and atraumatic.  Right Ear: Tympanic membrane, external ear  and ear canal normal.  Left Ear: Tympanic membrane, external ear and ear canal normal.  Nose: Mucosal edema and rhinorrhea present. Right sinus exhibits no maxillary sinus tenderness and no frontal sinus tenderness. Left sinus exhibits no maxillary sinus tenderness and no frontal sinus tenderness.  Mouth/Throat: Uvula is midline, oropharynx is clear and moist and mucous membranes are normal. No tonsillar exudate.  Eyes: Pupils are equal, round, and reactive to light. Conjunctivae and EOM are normal. Right eye exhibits no discharge. Left eye exhibits no discharge.  Neck: Normal range of motion.  Cardiovascular: Regular  rhythm and intact distal pulses.   Pulmonary/Chest: Effort normal and breath sounds normal. No respiratory distress. She has no decreased breath sounds. She has no wheezes. She has no rhonchi. She has no rales.  Abdominal: Soft. Bowel sounds are normal. She exhibits no distension. There is no tenderness.  Musculoskeletal: Normal range of motion.  Lymphadenopathy:    She has no cervical adenopathy.  Neurological: She is alert and oriented to person, place, and time.  Skin: Skin is warm and dry.  Psychiatric: She has a normal mood and affect.  Nursing note and vitals reviewed.    ED Treatments / Results  Labs (all labs ordered are listed, but only abnormal results are displayed) Labs Reviewed - No data to display  EKG  EKG Interpretation None       Radiology No results found.  Procedures Procedures (including critical care time)  Medications Ordered in ED Medications - No data to display   Initial Impression / Assessment and Plan / ED Course  I have reviewed the triage vital signs and the nursing notes.  Pertinent labs & imaging results that were available during my care of the patient were reviewed by me and considered in my medical decision making (see chart for details).     Pt presenting with 1 wk h/o L ear pain, nasal congestion, and cough. Physical exam shows normal TMs, nasal edema, and reassuring lung exam. Fetal heart tones reassuring at 134. Doubt bacterial infectious for cause of pt's sxs. Likely viral illness/nasal congestion. Will tx with flonase and tylenol for pain. F/u with PCP as needed. Return precautions given. Pt states she understands and agrees to plan.   Final Clinical Impressions(s) / ED Diagnoses   Final diagnoses:  Nasal congestion  Left ear pain    New Prescriptions Discharge Medication List as of 05/08/2017  1:34 PM    START taking these medications   Details  fluticasone (FLONASE) 50 MCG/ACT nasal spray Place 1 spray into both nostrils  daily., Starting Thu 05/08/2017, Print         [place], Terral Cooks, PA-C 05/08/17 2227    Loren RacerYelverton, David, MD 05/09/17 [phone]

## 2017-05-08 NOTE — ED Triage Notes (Signed)
Patient complains of left ear pain x 1 week. Patient is 7 months pregnant and normal fetal movement, no pregnancy concerns.

## 2017-05-08 NOTE — Discharge Instructions (Signed)
Use Flonase daily for the next week. Use Tylenol as needed for ear pain. Stay well-hydrated with water. Follow-up with your primary care doctor in 1 week if pain is not improving. Return to the emergency room or follow-up with her primary care doctor if you develop fever, chills, worsening pain, or any new or worsening symptoms.

## 2017-06-03 ENCOUNTER — Other Ambulatory Visit (HOSPITAL_COMMUNITY)
Admission: RE | Admit: 2017-06-03 | Discharge: 2017-06-03 | Disposition: A | Discharge status: Home / Self Care | Payer: Medicaid Other | Source: Ambulatory Visit | Attending: Family Medicine | Admitting: Family Medicine

## 2017-06-03 ENCOUNTER — Ambulatory Visit (INDEPENDENT_AMBULATORY_CARE_PROVIDER_SITE_OTHER): Payer: Medicaid Other | Admitting: Family Medicine

## 2017-06-03 ENCOUNTER — Encounter: Payer: Self-pay | Admitting: Family Medicine

## 2017-06-03 VITALS — BP 113/56 | HR 81 | Wt 225.3 lb

## 2017-06-03 DIAGNOSIS — Z124 Encounter for screening for malignant neoplasm of cervix: Secondary | ICD-10-CM

## 2017-06-03 DIAGNOSIS — O0930 Supervision of pregnancy with insufficient antenatal care, unspecified trimester: Secondary | ICD-10-CM

## 2017-06-03 DIAGNOSIS — O0993 Supervision of high risk pregnancy, unspecified, third trimester: Secondary | ICD-10-CM | POA: Diagnosis not present

## 2017-06-03 DIAGNOSIS — Z1151 Encounter for screening for human papillomavirus (HPV): Secondary | ICD-10-CM

## 2017-06-03 DIAGNOSIS — Z3A35 35 weeks gestation of pregnancy: Secondary | ICD-10-CM | POA: Diagnosis not present

## 2017-06-03 DIAGNOSIS — J4520 Mild intermittent asthma, uncomplicated: Secondary | ICD-10-CM | POA: Diagnosis not present

## 2017-06-03 DIAGNOSIS — Z6791 Unspecified blood type, Rh negative: Secondary | ICD-10-CM

## 2017-06-03 DIAGNOSIS — O0990 Supervision of high risk pregnancy, unspecified, unspecified trimester: Secondary | ICD-10-CM

## 2017-06-03 DIAGNOSIS — Z113 Encounter for screening for infections with a predominantly sexual mode of transmission: Secondary | ICD-10-CM | POA: Diagnosis not present

## 2017-06-03 DIAGNOSIS — Z23 Encounter for immunization: Secondary | ICD-10-CM | POA: Diagnosis not present

## 2017-06-03 DIAGNOSIS — O0933 Supervision of pregnancy with insufficient antenatal care, third trimester: Secondary | ICD-10-CM | POA: Diagnosis not present

## 2017-06-03 HISTORY — DX: Supervision of pregnancy with insufficient antenatal care, unspecified trimester: O09.30

## 2017-06-03 LAB — POCT URINALYSIS DIP (DEVICE)
BILIRUBIN URINE: NEGATIVE
Glucose, UA: NEGATIVE mg/dL
HGB URINE DIPSTICK: NEGATIVE
Ketones, ur: NEGATIVE mg/dL
NITRITE: NEGATIVE
PH: 6 (ref 5.0–8.0)
Protein, ur: NEGATIVE mg/dL
Urobilinogen, UA: 0.2 mg/dL (ref 0.0–1.0)

## 2017-06-03 LAB — OB RESULTS CONSOLE GC/CHLAMYDIA: GC PROBE AMP, GENITAL: NEGATIVE

## 2017-06-03 MED ORDER — RHO D IMMUNE GLOBULIN 1500 UNITS IM SOSY: 1500.0000 [IU] | PREFILLED_SYRINGE | Freq: Once | INTRAMUSCULAR | Status: DC

## 2017-06-03 MED ORDER — ALBUTEROL SULFATE HFA 108 (90 BASE) MCG/ACT IN AERS: 2.0000 | INHALATION_SPRAY | Freq: Four times a day (QID) | RESPIRATORY_TRACT | 2 refills | Status: DC | PRN

## 2017-06-03 NOTE — Patient Instructions (Addendum)
 Third Trimester of Pregnancy The third trimester is from week 28 through week 40 (months 7 through 9). The third trimester is a time when the unborn baby (fetus) is growing rapidly. At the end of the ninth month, the fetus is about 20 inches in length and weighs 6-10 pounds. Body changes during your third trimester Your body will continue to go through many changes during pregnancy. The changes vary from woman to woman. During the third trimester:  Your weight will continue to increase. You can expect to gain 25-35 pounds (11-16 kg) by the end of the pregnancy.  You may begin to get stretch marks on your hips, abdomen, and breasts.  You may urinate more often because the fetus is moving lower into your pelvis and pressing on your bladder.  You may develop or continue to have heartburn. This is caused by increased hormones that slow down muscles in the digestive tract.  You may develop or continue to have constipation because increased hormones slow digestion and cause the muscles that push waste through your intestines to relax.  You may develop hemorrhoids. These are swollen veins (varicose veins) in the rectum that can itch or be painful.  You may develop swollen, bulging veins (varicose veins) in your legs.  You may have increased body aches in the pelvis, back, or thighs. This is due to weight gain and increased hormones that are relaxing your joints.  You may have changes in your hair. These can include thickening of your hair, rapid growth, and changes in texture. Some women also have hair loss during or after pregnancy, or hair that feels dry or thin. Your hair will most likely return to normal after your baby is born.  Your breasts will continue to grow and they will continue to become tender. A yellow fluid (colostrum) may leak from your breasts. This is the first milk you are producing for your baby.  Your belly button may stick out.  You may notice more swelling in your  hands, face, or ankles.  You may have increased tingling or numbness in your hands, arms, and legs. The skin on your belly may also feel numb.  You may feel short of breath because of your expanding uterus.  You may have more problems sleeping. This can be caused by the size of your belly, increased need to urinate, and an increase in your body's metabolism.  You may notice the fetus "dropping," or moving lower in your abdomen (lightening).  You may have increased vaginal discharge.  You may notice your joints feel loose and you may have pain around your pelvic bone.  What to expect at prenatal visits You will have prenatal exams every 2 weeks until week 36. Then you will have weekly prenatal exams. During a routine prenatal visit:  You will be weighed to make sure you and the baby are growing normally.  Your blood pressure will be taken.  Your abdomen will be measured to track your baby's growth.  The fetal heartbeat will be listened to.  Any test results from the previous visit will be discussed.  You may have a cervical check near your due date to see if your cervix has softened or thinned (effaced).  You will be tested for Group B streptococcus. This happens between 35 and 37 weeks.  Your health care provider may ask you:  What your birth plan is.  How you are feeling.  If you are feeling the baby move.  If you have   had any abnormal symptoms, such as leaking fluid, bleeding, severe headaches, or abdominal cramping.  If you are using any tobacco products, including cigarettes, chewing tobacco, and electronic cigarettes.  If you have any questions.  Other tests or screenings that may be performed during your third trimester include:  Blood tests that check for low iron levels (anemia).  Fetal testing to check the health, activity level, and growth of the fetus. Testing is done if you have certain medical conditions or if there are problems during the  pregnancy.  Nonstress test (NST). This test checks the health of your baby to make sure there are no signs of problems, such as the baby not getting enough oxygen. During this test, a belt is placed around your belly. The baby is made to move, and its heart rate is monitored during movement.  What is false labor? False labor is a condition in which you feel small, irregular tightenings of the muscles in the womb (contractions) that usually go away with rest, changing position, or drinking water. These are called Braxton Hicks contractions. Contractions may last for hours, days, or even weeks before true labor sets in. If contractions come at regular intervals, become more frequent, increase in intensity, or become painful, you should see your health care provider. What are the signs of labor?  Abdominal cramps.  Regular contractions that start at 10 minutes apart and become stronger and more frequent with time.  Contractions that start on the top of the uterus and spread down to the lower abdomen and back.  Increased pelvic pressure and dull back pain.  A watery or bloody mucus discharge that comes from the vagina.  Leaking of amniotic fluid. This is also known as your "water breaking." It could be a slow trickle or a gush. Let your health care provider know if it has a color or strange odor. If you have any of these signs, call your health care provider right away, even if it is before your due date. Follow these instructions at home: Medicines  Follow your health care provider's instructions regarding medicine use. Specific medicines may be either safe or unsafe to take during pregnancy.  Take a prenatal vitamin that contains at least 600 micrograms (mcg) of folic acid.  If you develop constipation, try taking a stool softener if your health care provider approves. Eating and drinking  Eat a balanced diet that includes fresh fruits and vegetables, whole grains, good sources of protein  such as meat, eggs, or tofu, and low-fat dairy. Your health care provider will help you determine the amount of weight gain that is right for you.  Avoid raw meat and uncooked cheese. These carry germs that can cause birth defects in the baby.  If you have low calcium intake from food, talk to your health care provider about whether you should take a daily calcium supplement.  Eat four or five small meals rather than three large meals a day.  Limit foods that are high in fat and processed sugars, such as fried and sweet foods.  To prevent constipation: ? Drink enough fluid to keep your urine clear or pale yellow. ? Eat foods that are high in fiber, such as fresh fruits and vegetables, whole grains, and beans. Activity  Exercise only as directed by your health care provider. Most women can continue their usual exercise routine during pregnancy. Try to exercise for 30 minutes at least 5 days a week. Stop exercising if you experience uterine contractions.  Avoid   heavy lifting.  Do not exercise in extreme heat or humidity, or at high altitudes.  Wear low-heel, comfortable shoes.  Practice good posture.  You may continue to have sex unless your health care provider tells you otherwise. Relieving pain and discomfort  Take frequent breaks and rest with your legs elevated if you have leg cramps or low back pain.  Take warm sitz baths to soothe any pain or discomfort caused by hemorrhoids. Use hemorrhoid cream if your health care provider approves.  Wear a good support bra to prevent discomfort from breast tenderness.  If you develop varicose veins: ? Wear support pantyhose or compression stockings as told by your healthcare provider. ? Elevate your feet for 15 minutes, 3-4 times a day. Prenatal care  Write down your questions. Take them to your prenatal visits.  Keep all your prenatal visits as told by your health care provider. This is important. Safety  Wear your seat belt at  all times when driving.  Make a list of emergency phone numbers, including numbers for family, friends, the hospital, and police and fire departments. General instructions  Avoid cat litter boxes and soil used by cats. These carry germs that can cause birth defects in the baby. If you have a cat, ask someone to clean the litter box for you.  Do not travel far distances unless it is absolutely necessary and only with the approval of your health care provider.  Do not use hot tubs, steam rooms, or saunas.  Do not drink alcohol.  Do not use any products that contain nicotine or tobacco, such as cigarettes and e-cigarettes. If you need help quitting, ask your health care provider.  Do not use any medicinal herbs or unprescribed drugs. These chemicals affect the formation and growth of the baby.  Do not douche or use tampons or scented sanitary pads.  Do not cross your legs for long periods of time.  To prepare for the arrival of your baby: ? Take prenatal classes to understand, practice, and ask questions about labor and delivery. ? Make a trial run to the hospital. ? Visit the hospital and tour the maternity area. ? Arrange for maternity or paternity leave through employers. ? Arrange for family and friends to take care of pets while you are in the hospital. ? Purchase a rear-facing car seat and make sure you know how to install it in your car. ? Pack your hospital bag. ? Prepare the baby's nursery. Make sure to remove all pillows and stuffed animals from the baby's crib to prevent suffocation.  Visit your dentist if you have not gone during your pregnancy. Use a soft toothbrush to brush your teeth and be gentle when you floss. Contact a health care provider if:  You are unsure if you are in labor or if your water has broken.  You become dizzy.  You have mild pelvic cramps, pelvic pressure, or nagging pain in your abdominal area.  You have lower back pain.  You have persistent  nausea, vomiting, or diarrhea.  You have an unusual or bad smelling vaginal discharge.  You have pain when you urinate. Get help right away if:  Your water breaks before 37 weeks.  You have regular contractions less than 5 minutes apart before 37 weeks.  You have a fever.  You are leaking fluid from your vagina.  You have spotting or bleeding from your vagina.  You have severe abdominal pain or cramping.  You have rapid weight loss or weight   gain.  You have shortness of breath with chest pain.  You notice sudden or extreme swelling of your face, hands, ankles, feet, or legs.  Your baby makes fewer than 10 movements in 2 hours.  You have severe headaches that do not go away when you take medicine.  You have vision changes. Summary  The third trimester is from week 28 through week 40, months 7 through 9. The third trimester is a time when the unborn baby (fetus) is growing rapidly.  During the third trimester, your discomfort may increase as you and your baby continue to gain weight. You may have abdominal, leg, and back pain, sleeping problems, and an increased need to urinate.  During the third trimester your breasts will keep growing and they will continue to become tender. A yellow fluid (colostrum) may leak from your breasts. This is the first milk you are producing for your baby.  False labor is a condition in which you feel small, irregular tightenings of the muscles in the womb (contractions) that eventually go away. These are called Braxton Hicks contractions. Contractions may last for hours, days, or even weeks before true labor sets in.  Signs of labor can include: abdominal cramps; regular contractions that start at 10 minutes apart and become stronger and more frequent with time; watery or bloody mucus discharge that comes from the vagina; increased pelvic pressure and dull back pain; and leaking of amniotic fluid. This information is not intended to replace advice  given to you by your health care provider. Make sure you discuss any questions you have with your health care provider. Document Released: 08/06/2001 Document Revised: 01/18/2016 Document Reviewed: 10/13/2012 [employer] Interactive Patient Education  2017 [employer]   Breastfeeding Deciding to breastfeed is one of the best choices you can make for you and your baby. A change in hormones during pregnancy causes your breast tissue to grow and increases the number and size of your milk ducts. These hormones also allow proteins, sugars, and fats from your blood supply to make breast milk in your milk-producing glands. Hormones prevent breast milk from being released before your baby is born as well as prompt milk flow after birth. Once breastfeeding has begun, thoughts of your baby, as well as his or her sucking or crying, can stimulate the release of milk from your milk-producing glands. Benefits of breastfeeding For Your Baby  Your first milk (colostrum) helps your baby's digestive system function better.  There are antibodies in your milk that help your baby fight off infections.  Your baby has a lower incidence of asthma, allergies, and sudden infant death syndrome.  The nutrients in breast milk are better for your baby than infant formulas and are designed uniquely for your baby's needs.  Breast milk improves your baby's brain development.  Your baby is less likely to develop other conditions, such as childhood obesity, asthma, or type 2 diabetes mellitus.  For You  Breastfeeding helps to create a very special bond between you and your baby.  Breastfeeding is convenient. Breast milk is always available at the correct temperature and costs nothing.  Breastfeeding helps to burn calories and helps you lose the weight gained during pregnancy.  Breastfeeding makes your uterus contract to its prepregnancy size faster and slows bleeding (lochia) after you give birth.  Breastfeeding helps  to lower your risk of developing type 2 diabetes mellitus, osteoporosis, and breast or ovarian cancer later in life.  Signs that your baby is hungry Early Signs of Hunger    Increased alertness or activity.  Stretching.  Movement of the head from side to side.  Movement of the head and opening of the mouth when the corner of the mouth or cheek is stroked (rooting).  Increased sucking sounds, smacking lips, cooing, sighing, or squeaking.  Hand-to-mouth movements.  Increased sucking of fingers or hands.  Late Signs of Hunger  Fussing.  Intermittent crying.  Extreme Signs of Hunger Signs of extreme hunger will require calming and consoling before your baby will be able to breastfeed successfully. Do not wait for the following signs of extreme hunger to occur before you initiate breastfeeding:  Restlessness.  A loud, strong cry.  Screaming.  Breastfeeding basics Breastfeeding Initiation  Find a comfortable place to sit or lie down, with your neck and back well supported.  Place a pillow or rolled up blanket under your baby to bring him or her to the level of your breast (if you are seated). Nursing pillows are specially designed to help support your arms and your baby while you breastfeed.  Make sure that your baby's abdomen is facing your abdomen.  Gently massage your breast. With your fingertips, massage from your chest wall toward your nipple in a circular motion. This encourages milk flow. You may need to continue this action during the feeding if your milk flows slowly.  Support your breast with 4 fingers underneath and your thumb above your nipple. Make sure your fingers are well away from your nipple and your baby's mouth.  Stroke your baby's lips gently with your finger or nipple.  When your baby's mouth is open wide enough, quickly bring your baby to your breast, placing your entire nipple and as much of the colored area around your nipple (areola) as possible into  your baby's mouth. ? More areola should be visible above your baby's upper lip than below the lower lip. ? Your baby's tongue should be between his or her lower gum and your breast.  Ensure that your baby's mouth is correctly positioned around your nipple (latched). Your baby's lips should create a seal on your breast and be turned out (everted).  It is common for your baby to suck about 2-3 minutes in order to start the flow of breast milk.  Latching Teaching your baby how to latch on to your breast properly is very important. An improper latch can cause nipple pain and decreased milk supply for you and poor weight gain in your baby. Also, if your baby is not latched onto your nipple properly, he or she may swallow some air during feeding. This can make your baby fussy. Burping your baby when you switch breasts during the feeding can help to get rid of the air. However, teaching your baby to latch on properly is still the best way to prevent fussiness from swallowing air while breastfeeding. Signs that your baby has successfully latched on to your nipple:  Silent tugging or silent sucking, without causing you pain.  Swallowing heard between every 3-4 sucks.  Muscle movement above and in front of his or her ears while sucking.  Signs that your baby has not successfully latched on to nipple:  Sucking sounds or smacking sounds from your baby while breastfeeding.  Nipple pain.  If you think your baby has not latched on correctly, slip your finger into the corner of your baby's mouth to break the suction and place it between your baby's gums. Attempt breastfeeding initiation again. Signs of Successful Breastfeeding Signs from your baby:  A   gradual decrease in the number of sucks or complete cessation of sucking.  Falling asleep.  Relaxation of his or her body.  Retention of a small amount of milk in his or her mouth.  Letting go of your breast by himself or herself.  Signs from  you:  Breasts that have increased in firmness, weight, and size 1-3 hours after feeding.  Breasts that are softer immediately after breastfeeding.  Increased milk volume, as well as a change in milk consistency and color by the fifth day of breastfeeding.  Nipples that are not sore, cracked, or bleeding.  Signs That Your Baby is Getting Enough Milk  Wetting at least 1-2 diapers during the first 24 hours after birth.  Wetting at least 5-6 diapers every 24 hours for the first week after birth. The urine should be clear or pale yellow by 5 days after birth.  Wetting 6-8 diapers every 24 hours as your baby continues to grow and develop.  At least 3 stools in a 24-hour period by age 5 days. The stool should be soft and yellow.  At least 3 stools in a 24-hour period by age 7 days. The stool should be seedy and yellow.  No loss of weight greater than 10% of birth weight during the first 3 days of age.  Average weight gain of 4-7 ounces (113-198 g) per week after age 4 days.  Consistent daily weight gain by age 5 days, without weight loss after the age of 2 weeks.  After a feeding, your baby may spit up a small amount. This is common. Breastfeeding frequency and duration Frequent feeding will help you make more milk and can prevent sore nipples and breast engorgement. Breastfeed when you feel the need to reduce the fullness of your breasts or when your baby shows signs of hunger. This is called "breastfeeding on demand." Avoid introducing a pacifier to your baby while you are working to establish breastfeeding (the first 4-6 weeks after your baby is born). After this time you may choose to use a pacifier. Research has shown that pacifier use during the first year of a baby's life decreases the risk of sudden infant death syndrome (SIDS). Allow your baby to feed on each breast as long as he or she wants. Breastfeed until your baby is finished feeding. When your baby unlatches or falls asleep  while feeding from the first breast, offer the second breast. Because newborns are often sleepy in the first few weeks of life, you may need to awaken your baby to get him or her to feed. Breastfeeding times will vary from baby to baby. However, the following rules can serve as a guide to help you ensure that your baby is properly fed:  Newborns (babies 4 weeks of age or younger) may breastfeed every 1-3 hours.  Newborns should not go longer than 3 hours during the day or 5 hours during the night without breastfeeding.  You should breastfeed your baby a minimum of 8 times in a 24-hour period until you begin to introduce solid foods to your baby at around 6 months of age.  Breast milk pumping Pumping and storing breast milk allows you to ensure that your baby is exclusively fed your breast milk, even at times when you are unable to breastfeed. This is especially important if you are going back to work while you are still breastfeeding or when you are not able to be present during feedings. Your lactation consultant can give you guidelines on how   long it is safe to store breast milk. A breast pump is a machine that allows you to pump milk from your breast into a sterile bottle. The pumped breast milk can then be stored in a refrigerator or freezer. Some breast pumps are operated by hand, while others use electricity. Ask your lactation consultant which type will work best for you. Breast pumps can be purchased, but some hospitals and breastfeeding support groups lease breast pumps on a monthly basis. A lactation consultant can teach you how to hand express breast milk, if you prefer not to use a pump. Caring for your breasts while you breastfeed Nipples can become dry, cracked, and sore while breastfeeding. The following recommendations can help keep your breasts moisturized and healthy:  Avoid using soap on your nipples.  Wear a supportive bra. Although not required, special nursing bras and tank  tops are designed to allow access to your breasts for breastfeeding without taking off your entire bra or top. Avoid wearing underwire-style bras or extremely tight bras.  Air dry your nipples for 3-4minutes after each feeding.  Use only cotton bra pads to absorb leaked breast milk. Leaking of breast milk between feedings is normal.  Use lanolin on your nipples after breastfeeding. Lanolin helps to maintain your skin's normal moisture barrier. If you use pure lanolin, you do not need to wash it off before feeding your baby again. Pure lanolin is not toxic to your baby. You may also hand express a few drops of breast milk and gently massage that milk into your nipples and allow the milk to air dry.  In the first few weeks after giving birth, some women experience extremely full breasts (engorgement). Engorgement can make your breasts feel heavy, warm, and tender to the touch. Engorgement peaks within 3-5 days after you give birth. The following recommendations can help ease engorgement:  Completely empty your breasts while breastfeeding or pumping. You may want to start by applying warm, moist heat (in the shower or with warm water-soaked hand towels) just before feeding or pumping. This increases circulation and helps the milk flow. If your baby does not completely empty your breasts while breastfeeding, pump any extra milk after he or she is finished.  Wear a snug bra (nursing or regular) or tank top for 1-2 days to signal your body to slightly decrease milk production.  Apply ice packs to your breasts, unless this is too uncomfortable for you.  Make sure that your baby is latched on and positioned properly while breastfeeding.  If engorgement persists after 48 hours of following these recommendations, contact your health care provider or a lactation consultant. Overall health care recommendations while breastfeeding  Eat healthy foods. Alternate between meals and snacks, eating 3 of each per  day. Because what you eat affects your breast milk, some of the foods may make your baby more irritable than usual. Avoid eating these foods if you are sure that they are negatively affecting your baby.  Drink milk, fruit juice, and water to satisfy your thirst (about 10 glasses a day).  Rest often, relax, and continue to take your prenatal vitamins to prevent fatigue, stress, and anemia.  Continue breast self-awareness checks.  Avoid chewing and smoking tobacco. Chemicals from cigarettes that pass into breast milk and exposure to secondhand smoke may harm your baby.  Avoid alcohol and drug use, including marijuana. Some medicines that may be harmful to your baby can pass through breast milk. It is important to ask your health care   provider before taking any medicine, including all over-the-counter and prescription medicine as well as vitamin and herbal supplements. It is possible to become pregnant while breastfeeding. If birth control is desired, ask your health care provider about options that will be safe for your baby. Contact a health care provider if:  You feel like you want to stop breastfeeding or have become frustrated with breastfeeding.  You have painful breasts or nipples.  Your nipples are cracked or bleeding.  Your breasts are red, tender, or warm.  You have a swollen area on either breast.  You have a fever or chills.  You have nausea or vomiting.  You have drainage other than breast milk from your nipples.  Your breasts do not become full before feedings by the fifth day after you give birth.  You feel sad and depressed.  Your baby is too sleepy to eat well.  Your baby is having trouble sleeping.  Your baby is wetting less than 3 diapers in a 24-hour period.  Your baby has less than 3 stools in a 24-hour period.  Your baby's skin or the white part of his or her eyes becomes yellow.  Your baby is not gaining weight by 5 days of age. Get help right away  if:  Your baby is overly tired (lethargic) and does not want to wake up and feed.  Your baby develops an unexplained fever. This information is not intended to replace advice given to you by your health care provider. Make sure you discuss any questions you have with your health care provider. Document Released: 08/12/2005 Document Revised: 01/24/2016 Document Reviewed: 02/03/2013 Elsevier Interactive Patient Education  2017 Elsevier Inc.  

## 2017-06-03 NOTE — Progress Notes (Signed)
    Subjective:    Erin Tran is a [id number] [redacted]w[redacted]d being seen today for her first obstetrical visit.  Her obstetrical history is significant for smoker and late to care. Patient does intend to breast feed. Pregnancy history fully reviewed.  Patient reports no complaints.  Vitals:   06/03/17 1358  BP: (!) 113/56  Pulse: 81  Weight: 225 lb 4.8 oz (102.2 kg)    HISTORY: OB History  Gravida Para Term Preterm AB Living  0 1 2  SAB TAB Ectopic Multiple Live Births  0 0 0   2    # Outcome Date GA Lbr Len/2nd Weight Sex Delivery Anes PTL Lv  4 Current           3 Term 06/29/15 [redacted]w[redacted]d / 00:45 8 lb 2.3 oz (3.695 kg) M Vag-Spont EPI  LIV  2 Term 12/08/12 [redacted]w[redacted]d 13:00 / 00:38 7 lb 9 oz (3.43 kg) M Vag-Spont EPI  LIV  1 AB 2012 [redacted]w[redacted]d            Past Medical History:  Diagnosis Date  . Medical history non-contributory   . Pregnant    Past Surgical History:  Procedure Laterality Date  . NO PAST SURGERIES     Family History  Problem Relation Age of Onset  . Diabetes Maternal Grandmother   . Cancer Neg Hx   . Hypertension Neg Hx   . Hyperlipidemia Neg Hx   . Seizures Mother   . Stroke Neg Hx      Exam    Uterus:   FH 36  Pelvic Exam:    Perineum: Normal Perineum   Vulva: Bartholin's, Urethra, Skene's normal   Vagina:  normal mucosa, normal discharge   Cervix: cervical motion tenderness   Adnexa: normal adnexa   Bony Pelvis: average  System: Breast:  normal appearance, no masses or tenderness   Skin: normal coloration and turgor, no rashes    Neurologic: oriented, normal, gait normal; reflexes normal and symmetric   Extremities: normal strength, tone, and muscle mass   HEENT extra ocular movement intact, sclera clear, anicteric and oropharynx clear, no lesions   Mouth/Teeth mucous membranes moist, pharynx normal without lesions   Neck supple   Cardiovascular: regular rate and rhythm, no murmurs or gallops   Respiratory:  appears well, vitals normal, no  respiratory distress, acyanotic, normal RR, ear and throat exam is normal, neck free of mass or lymphadenopathy, chest clear, no wheezing, crepitations, rhonchi, normal symmetric air entry   Abdomen: soft, non-tender; bowel sounds normal; no masses,  no organomegaly      Assessment/Plan:    Pregnancy: [id number] 1. Supervision of high risk pregnancy, antepartum New OB labs - Hemoglobin A1c - Culture, OB Urine - Obstetric Panel, Including HIV - Strep Gp B NAA - Cytology - PAP - Flu Vaccine QUAD 36+ mos IM - Tdap vaccine greater than or equal to 7yo IM  2. Late prenatal care - Tdap vaccine greater than or equal to 7yo IM  3. Blood type, Rh negative Needs Rhogam - Rho D Immune Globulin SOSY 1,500 Units; Inject 1,500 Units into the muscle once.  4. Mild intermittent asthma without complication Refilled MDI - albuterol (PROVENTIL HFA;VENTOLIN HFA) 108 (90 Base) MCG/ACT inhaler; Inhale 2 puffs into the lungs every 6 (six) hours as needed for wheezing or shortness of breath.  Dispense: 1 Inhaler; Refill: 2    Reva Bores 06/03/2017

## 2017-06-05 ENCOUNTER — Ambulatory Visit: Payer: Medicaid Other

## 2017-06-05 LAB — STREP GP B NAA: Strep Gp B NAA: NEGATIVE

## 2017-06-05 LAB — CYTOLOGY - PAP
Chlamydia: NEGATIVE
DIAGNOSIS: NEGATIVE
Neisseria Gonorrhea: NEGATIVE

## 2017-06-08 ENCOUNTER — Inpatient Hospital Stay (HOSPITAL_COMMUNITY)
Admission: AD | Admit: 2017-06-08 | Discharge: 2017-06-12 | DRG: 805 | Disposition: A | Discharge status: Home / Self Care | Payer: Medicaid Other | Source: Ambulatory Visit | Attending: Family Medicine | Admitting: Family Medicine

## 2017-06-08 ENCOUNTER — Encounter (HOSPITAL_COMMUNITY): Payer: Self-pay

## 2017-06-08 DIAGNOSIS — O99334 Smoking (tobacco) complicating childbirth: Secondary | ICD-10-CM | POA: Diagnosis present

## 2017-06-08 DIAGNOSIS — Z3A36 36 weeks gestation of pregnancy: Secondary | ICD-10-CM

## 2017-06-08 DIAGNOSIS — O42913 Preterm premature rupture of membranes, unspecified as to length of time between rupture and onset of labor, third trimester: Principal | ICD-10-CM | POA: Diagnosis present

## 2017-06-08 DIAGNOSIS — O42113 Preterm premature rupture of membranes, onset of labor more than 24 hours following rupture, third trimester: Secondary | ICD-10-CM | POA: Diagnosis not present

## 2017-06-08 DIAGNOSIS — O9952 Diseases of the respiratory system complicating childbirth: Secondary | ICD-10-CM | POA: Diagnosis present

## 2017-06-08 DIAGNOSIS — D649 Anemia, unspecified: Secondary | ICD-10-CM | POA: Diagnosis present

## 2017-06-08 DIAGNOSIS — O26893 Other specified pregnancy related conditions, third trimester: Secondary | ICD-10-CM | POA: Diagnosis present

## 2017-06-08 DIAGNOSIS — O4292 Full-term premature rupture of membranes, unspecified as to length of time between rupture and onset of labor: Secondary | ICD-10-CM | POA: Diagnosis present

## 2017-06-08 DIAGNOSIS — J45909 Unspecified asthma, uncomplicated: Secondary | ICD-10-CM | POA: Diagnosis present

## 2017-06-08 DIAGNOSIS — Z6791 Unspecified blood type, Rh negative: Secondary | ICD-10-CM

## 2017-06-08 DIAGNOSIS — O9902 Anemia complicating childbirth: Secondary | ICD-10-CM | POA: Diagnosis present

## 2017-06-08 DIAGNOSIS — F17210 Nicotine dependence, cigarettes, uncomplicated: Secondary | ICD-10-CM | POA: Diagnosis present

## 2017-06-08 DIAGNOSIS — O0990 Supervision of high risk pregnancy, unspecified, unspecified trimester: Secondary | ICD-10-CM

## 2017-06-08 HISTORY — DX: Unspecified asthma, uncomplicated: J45.909

## 2017-06-08 HISTORY — DX: Full-term premature rupture of membranes, unspecified as to length of time between rupture and onset of labor: O42.92

## 2017-06-08 LAB — CBC
HCT: 29.5 % — ABNORMAL LOW (ref 36.0–46.0)
HEMOGLOBIN: 9.8 g/dL — AB (ref 12.0–15.0)
MCH: 29.8 pg (ref 26.0–34.0)
MCHC: 33.2 g/dL (ref 30.0–36.0)
MCV: 89.7 fL (ref 78.0–100.0)
Platelets: 257 10*3/uL (ref 150–400)
RBC: 3.29 MIL/uL — AB (ref 3.87–5.11)
RDW: 14.3 % (ref 11.5–15.5)
WBC: 9.2 10*3/uL (ref 4.0–10.5)

## 2017-06-08 LAB — OB RESULTS CONSOLE HIV ANTIBODY (ROUTINE TESTING): HIV: NONREACTIVE

## 2017-06-08 LAB — URINE CULTURE, OB REFLEX

## 2017-06-08 LAB — RAPID HIV SCREEN (HIV 1/2 AB+AG)
HIV 1/2 ANTIBODIES: NONREACTIVE
HIV-1 P24 Antigen - HIV24: NONREACTIVE

## 2017-06-08 LAB — HEPATITIS B SURFACE ANTIGEN: Hepatitis B Surface Ag: NEGATIVE

## 2017-06-08 LAB — POCT FERN TEST: POCT Fern Test: POSITIVE

## 2017-06-08 LAB — CULTURE, OB URINE

## 2017-06-08 MED ORDER — OXYTOCIN BOLUS FROM INFUSION
500.0000 mL | Freq: Once | INTRAVENOUS | Status: AC
  Administered 2017-06-10: 500 mL via INTRAVENOUS

## 2017-06-08 MED ORDER — ACETAMINOPHEN 325 MG PO TABS: 650.0000 mg | ORAL_TABLET | ORAL | Status: DC | PRN

## 2017-06-08 MED ORDER — FLEET ENEMA 7-19 GM/118ML RE ENEM: 1.0000 | ENEMA | RECTAL | Status: DC | PRN

## 2017-06-08 MED ORDER — SOD CITRATE-CITRIC ACID 500-334 MG/5ML PO SOLN: 30.0000 mL | ORAL | Status: DC | PRN

## 2017-06-08 MED ORDER — ZOLPIDEM TARTRATE 5 MG PO TABS
5.0000 mg | ORAL_TABLET | Freq: Once | ORAL | Status: AC
Start: 2017-06-08 — End: 2017-06-08
  Administered 2017-06-08: 5 mg via ORAL
  Filled 2017-06-08: qty 1

## 2017-06-08 MED ORDER — ONDANSETRON HCL 4 MG/2ML IJ SOLN: 4.0000 mg | Freq: Four times a day (QID) | INTRAMUSCULAR | Status: DC | PRN

## 2017-06-08 MED ORDER — OXYCODONE-ACETAMINOPHEN 5-325 MG PO TABS: 2.0000 | ORAL_TABLET | ORAL | Status: DC | PRN

## 2017-06-08 MED ORDER — OXYTOCIN 40 UNITS IN LACTATED RINGERS INFUSION - SIMPLE MED: 2.5000 [IU]/h | INTRAVENOUS | Status: DC

## 2017-06-08 MED ORDER — BETAMETHASONE SOD PHOS & ACET 6 (3-3) MG/ML IJ SUSP
12.0000 mg | INTRAMUSCULAR | Status: AC
  Administered 2017-06-08 – 2017-06-09 (×2): 12 mg via INTRAMUSCULAR
  Filled 2017-06-08 (×2): qty 2

## 2017-06-08 MED ORDER — OXYCODONE-ACETAMINOPHEN 5-325 MG PO TABS: 1.0000 | ORAL_TABLET | ORAL | Status: DC | PRN

## 2017-06-08 MED ORDER — LACTATED RINGERS IV SOLN
INTRAVENOUS | Status: DC
  Administered 2017-06-08 – 2017-06-09 (×3): via INTRAVENOUS

## 2017-06-08 MED ORDER — LIDOCAINE HCL (PF) 1 % IJ SOLN: 30.0000 mL | INTRAMUSCULAR | Status: DC | PRN

## 2017-06-08 MED ORDER — LACTATED RINGERS IV SOLN: 500.0000 mL | INTRAVENOUS | Status: DC | PRN

## 2017-06-08 NOTE — Anesthesia Pain Management Evaluation Note (Signed)
  CRNA Pain Management Visit Note  Patient: Erin Tran, 21 y.o., female  "Hello I am a member of the anesthesia team at [facility]. We have an anesthesia team available at all times to provide care throughout the hospital, including epidural management and anesthesia for C-section. I don't know your plan for the delivery whether it a natural birth, water birth, IV sedation, nitrous supplementation, doula or epidural, but we want to meet your pain goals."   1.Was your pain managed to your expectations on prior hospitalizations?   No prior hospitalizations  2.What is your expectation for pain management during this hospitalization?     Labor support without medications and Epidural  3.How can we help you reach that goal? Patient would like to try natural, but open to epidural   Record the patient's initial score and the patient's pain goal.   Pain: 0  Pain Goal: 10 [facility] wants you to be able to say your pain was always managed very well.  Rica Records 06/08/2017

## 2017-06-08 NOTE — MAU Note (Signed)
Urine in lab 

## 2017-06-08 NOTE — H&P (Signed)
Erin Tran is a 21 y.o. female [phone] @ 36 wks  presenting for SROM @ 0800 this morning.. OB History    Gravida Para Term Preterm AB Living   0 1 2   SAB TAB Ectopic Multiple Live Births   0 0 0   2     Past Medical History:  Diagnosis Date  . Asthma   . Medical history non-contributory   . Pregnant    Past Surgical History:  Procedure Laterality Date  . NO PAST SURGERIES     Family History: family history includes Diabetes in her maternal grandmother; Seizures in her mother. Social History:  reports that she has been smoking Cigarettes.  She has been smoking about 0.25 packs per day. She has never used smokeless tobacco. She reports that she does not drink alcohol or use drugs.     Maternal Diabetes: No Genetic Screening: Normal Maternal Ultrasounds/Referrals: Normal Fetal Ultrasounds or other Referrals:  None Maternal Substance Abuse:  No Significant Maternal Medications:  None Significant Maternal Lab Results:  None Other Comments:  late to care one visit at 35 wks  ROS Maternal Medical History:  Reason for admission: Rupture of membranes.   Contractions: none  Fetal activity: Perceived fetal activity is normal.   Last perceived fetal movement was within the past hour.    Prenatal complications: no prenatal complications Prenatal Complications - Diabetes: none.    Dilation: 1 Effacement (%): Thick Station: -3 Exam by:: D Hart Rochester, CNM Blood pressure (!) 116/54, pulse 90, temperature 98.1 F (36.7 C), temperature source Oral, resp. rate 18, height  (1.676 m), weight 225 lb (102.1 kg), last menstrual period 09/11/2016, unknown if currently breastfeeding. Maternal Exam:  Uterine Assessment: No contractions  Abdomen: Patient reports no abdominal tenderness. Fetal presentation: vertex  Introitus: Normal vulva. Normal vagina.  Ferning test: positive.  Amniotic fluid character: clear.  Pelvis: adequate for delivery.   Cervix: Cervix evaluated  by digital exam.     Fetal Exam Fetal Monitor Review: Mode: ultrasound.   Variability: moderate (6-25 bpm).   Pattern: accelerations present and no decelerations.    Fetal State Assessment: Category I - tracings are normal.     Physical Exam  Constitutional: She is oriented to person, place, and time. She appears well-developed and well-nourished.  HENT:  Head: Normocephalic and atraumatic.  Right Ear: External ear normal.  Mouth/Throat: Oropharynx is clear and moist.  Eyes: Pupils are equal, round, and reactive to light.  Cardiovascular: Normal rate, regular rhythm, normal heart sounds and intact distal pulses.   Respiratory: Effort normal and breath sounds normal.  GI: Soft. Bowel sounds are normal.  Genitourinary: Vagina normal and uterus normal.  Musculoskeletal: Normal range of motion.  Neurological: She is alert and oriented to person, place, and time. She has normal reflexes.  Skin: Skin is warm and dry.  Psychiatric: She has a normal mood and affect. Her behavior is normal. Judgment and thought content normal.    Prenatal labs: ABO, Rh:   Antibody:   Rubella:   RPR:    HBsAg:    HIV:    GBS: Negative (10/09 1529)   Assessment/Plan: Fern pos Gross ROM noted GBS neg SVE 1/th/post/high admit   Wyvonnia Dusky 06/08/2017, 12:57 PM

## 2017-06-08 NOTE — Progress Notes (Signed)
Erin Tran is a 21 y.o. [id number] at [redacted]w[redacted]d  admitted for PROM  Subjective: Pt sitting up in chair.  Denies any ctx or other discomforts at this time.   Objective: BP 107/64   Pulse 85   Temp 98 F (36.7 C) (Oral)   Resp 18   Ht  (1.676 m)   Wt 102.1 kg (225 lb)   LMP 09/11/2016   BMI 36.32 kg/m  No intake/output data recorded.  FHT:  FHR: 135 bpm, moderate variability, + accels, no decels UC:  none  Assessment / Plan: PROM  Anemia of pregnancy  Fetal Wellbeing:  Category I  Continue expectant management  Cinthya Bowmaker-Kareen, SNM 06/08/2017, 7:36 PM I assessed this pt and agree with the above assessemnt

## 2017-06-08 NOTE — Progress Notes (Signed)
Erin Tran is a 21 y.o. [id number] at [redacted]w[redacted]d admitted for PROM  Subjective:  Pt sitting comfortable in bed.  Denies feeling any ctx at this time.  No questions about plan of care.  Objective: BP 134/61   Pulse 89   Temp 97.9 F (36.6 C) (Axillary)   Resp 18   Ht  (1.676 m)   Wt 102.1 kg (225 lb)   LMP 09/11/2016   BMI 36.32 kg/m  No intake/output data recorded.   FHT:  Baseline 135, moderate variability, + accels, no decels UC:   none SVE:   Dilation: 1 Effacement (%): Thick Station: -3 Exam by:: Erin Tran, CNM   Assessment / Plan: PROM  Anemia of pregnancy  Fetal Wellbeing:  Category I  Monitor vitals BMZ x 1 @ 1213 10/14 Consult physician. Expectant management at this time.   Erin Tran, SNM 06/08/2017, 3:55 PM I assessed this pt and agree with the above assessment

## 2017-06-08 NOTE — MAU Note (Signed)
Pt states that at 0900 today she was getting into her car and a steady stream of clear fluid starting leaking from her vagina.  Pt is still feeling some trickling upon arriving to hospital.  No vaginal bleeding, pt states baby is moving normally.

## 2017-06-09 ENCOUNTER — Ambulatory Visit: Payer: Medicaid Other

## 2017-06-09 LAB — RPR: RPR: NONREACTIVE

## 2017-06-09 MED ORDER — TERBUTALINE SULFATE 1 MG/ML IJ SOLN: 0.2500 mg | Freq: Once | INTRAMUSCULAR | Status: DC | PRN

## 2017-06-09 MED ORDER — PENICILLIN G POTASSIUM 5000000 UNITS IJ SOLR
5.0000 10*6.[IU] | Freq: Once | INTRAVENOUS | Status: AC
  Administered 2017-06-09: 5 10*6.[IU] via INTRAVENOUS
  Filled 2017-06-09: qty 5

## 2017-06-09 MED ORDER — FENTANYL CITRATE (PF) 100 MCG/2ML IJ SOLN
100.0000 ug | INTRAMUSCULAR | Status: DC | PRN
  Administered 2017-06-09 – 2017-06-10 (×5): 100 ug via INTRAVENOUS
  Filled 2017-06-09 (×5): qty 2

## 2017-06-09 MED ORDER — PENICILLIN G POT IN DEXTROSE 60000 UNIT/ML IV SOLN
3.0000 10*6.[IU] | INTRAVENOUS | Status: DC
  Administered 2017-06-09 – 2017-06-10 (×5): 3 10*6.[IU] via INTRAVENOUS
  Filled 2017-06-09 (×6): qty 50

## 2017-06-09 MED ORDER — OXYTOCIN 40 UNITS IN LACTATED RINGERS INFUSION - SIMPLE MED
1.0000 m[IU]/min | INTRAVENOUS | Status: DC
  Administered 2017-06-09: 2 m[IU]/min via INTRAVENOUS
  Administered 2017-06-10: 16 m[IU]/min via INTRAVENOUS
  Filled 2017-06-09 (×2): qty 1000

## 2017-06-09 NOTE — Progress Notes (Signed)
LABOR PROGRESS NOTE  Erin Tran is a 21 y.o. [id number] at [redacted]w[redacted]d  admitted for PPROM  Subjective: Doing well. Reports being comfortable, not feeling contractions  Objective: BP (!) 119/56   Pulse 95   Temp 98.5 F (36.9 C) (Oral)   Resp 18   Ht  (1.676 m)   Wt 225 lb (102.1 kg)   LMP 09/11/2016   BMI 36.32 kg/m  or  Vitals:   06/09/17 0346 06/09/17 0527 06/09/17 0528 06/09/17 0736  BP: (!) 113/49 117/60 117/60 (!) 119/56  Pulse: 86 89 89 95  Resp: Temp: 98.3 F (36.8 C) 98.2 F (36.8 C)  98.5 F (36.9 C)  TempSrc: Oral Oral  Oral  Weight:      Height:        SVD: Dilation: 3 Effacement (%): Thick Station: -1 Presentation: Vertex Exam by:: Dr. Rachelle Hora  FHT: baseline rate 140, moderate varibility, +acel, occasional variable decel Toco: not tracing ctx  Labs: Lab Results  Component Value Date   WBC 9.2 06/08/2017   HGB 9.8 (L) 06/08/2017   HCT 29.5 (L) 06/08/2017   MCV 89.7 06/08/2017   PLT 257 06/08/2017    Patient Active Problem List   Diagnosis Date Noted  . Full-term premature rupture of membranes 06/08/2017  . Late prenatal care 06/03/2017  . Blood type, Rh negative 06/28/2015  . Supervision of high risk pregnancy, antepartum 06/27/2015  . Asthma, intermittent 10/29/2014    Assessment / Plan: 21 y.o. Erin Tran at [redacted]w[redacted]d here for PPROM at 0900 on 06/08/17  Labor: Will start IV Pitocin Prematurity: BMZ #2 to be given at 1215pm Fetal Wellbeing:  Cat II (occasional variables) Pain Control:  Per patient's request. Planning on epidural when in active labor Anticipated MOD:  SVD  Frederik Pear, MD 06/09/2017, 9:21 AM

## 2017-06-09 NOTE — Progress Notes (Signed)
LABOR PROGRESS NOTE  Erin Tran is a 21 y.o. [id number] at [redacted]w[redacted]d  admitted for PPROM  Subjective: Doing well. Starting to get more uncomfortable with her contractions. Requesting pain medications.  Objective: BP (!) 93/47   Pulse 87   Temp 98.8 F (37.1 C) (Oral)   Resp 16   Ht  (1.676 m)   Wt 102.1 kg (225 lb)   LMP 09/11/2016   BMI 36.32 kg/m  or  Vitals:   06/09/17 1900 06/09/17 2006 06/09/17 2036 06/09/17 2056  BP: 123/62 112/67 (!) 111/56 (!) 93/47  Pulse: 88 87 74 87  Resp: Temp:  98.8 F (37.1 C)    TempSrc:  Oral    Weight:      Height:        Dilation: 3.5 Effacement (%): 20, 30 Cervical Position: Posterior Station: -3 Presentation: Vertex Exam by:: Dr. Nira Retort FHT: baseline rate 125, moderate varibility, +acel, no decel Toco: ctx q2-3 min  Pitocin /min  Labs: Lab Results  Component Value Date   WBC 9.2 06/08/2017   HGB 9.8 (L) 06/08/2017   HCT 29.5 (L) 06/08/2017   MCV 89.7 06/08/2017   PLT 257 06/08/2017    Patient Active Problem List   Diagnosis Date Noted  . Full-term premature rupture of membranes 06/08/2017  . Late prenatal care 06/03/2017  . Blood type, Rh negative 06/28/2015  . Supervision of high risk pregnancy, antepartum 06/27/2015  . Asthma, intermittent 10/29/2014    Assessment / Plan: 21 y.o. [id number] at [redacted]w[redacted]d here for PPROM at 0900 on 06/08/17.  Labor: Continue titrating IV Pit. Minimize cervical checks. Prematurity: s/p BMZ x 2 Fetal Wellbeing:  Cat I Pain Control:  Per patient's request. Planning on epidural when in active labor Anticipated MOD:  SVD  Erin Ada, DO 06/09/2017, 9:55 PM

## 2017-06-09 NOTE — Progress Notes (Signed)
Labor Progress Note [name] is a 21 y.o. [phone] at [redacted]w[redacted]d presented for PPROM S: Patient is feeling more uncomfortable, so I was called to bedside  O:  BP 117/60   Pulse 89   Temp 98.2 F (36.8 C) (Oral)   Resp 16   Ht  (1.676 m)   Wt 225 lb (102.1 kg)   LMP 09/11/2016   BMI 36.32 kg/m  EFM: 150/mod var/early decels  CVE: Dilation: 3 Effacement (%): Thick Station: -1 Presentation: Vertex Exam by:: Dr. Rachelle Hora    A&P: 21 y.o. [id number] [redacted]w[redacted]d here for PPROM #Labor: Did cervical exam since patient was uncomfortable and EFM showed early decelerations. Patient is 3 cm. Will not augment at this time. Second dose of betamethasone due at 1215 today.Anticipate SVD. #FWB: cat 1  Christoper Bushey, DO 5:46 AM

## 2017-06-09 NOTE — Progress Notes (Signed)
LABOR PROGRESS NOTE  Erin Tran is a 21 y.o. [id number] at [redacted]w[redacted]d  admitted for PPROM  Subjective: Doing well. Reports being comfortable, not feeling contractions  Objective: BP 125/61   Pulse 83   Temp 98.1 F (36.7 C) (Oral)   Resp 16   Ht  (1.676 m)   Wt 225 lb (102.1 kg)   LMP 09/11/2016   BMI 36.32 kg/m  or  Vitals:   06/09/17 1647 06/09/17 1700 06/09/17 1730 06/09/17 1800  BP: (!) 118/45 (!) 117/48 115/62 125/61  Pulse: 87 85 92 83  Resp:    16  Temp:    98.1 F (36.7 C)  TempSrc:    Oral  Weight:      Height:        SVD: Dilation: 3.5 Effacement (%): 20, 30 Cervical Position: Posterior Station: -3 Presentation: Vertex Exam by:: Dr. Nira Retort FHT: baseline rate 143, moderate varibility, +acel, occasional variable decel Toco: ctx q2-4 min  Labs: Lab Results  Component Value Date   WBC 9.2 06/08/2017   HGB 9.8 (L) 06/08/2017   HCT 29.5 (L) 06/08/2017   MCV 89.7 06/08/2017   PLT 257 06/08/2017    Patient Active Problem List   Diagnosis Date Noted  . Full-term premature rupture of membranes 06/08/2017  . Late prenatal care 06/03/2017  . Blood type, Rh negative 06/28/2015  . Supervision of high risk pregnancy, antepartum 06/27/2015  . Asthma, intermittent 10/29/2014    Assessment / Plan: 21 y.o. [id number] at [redacted]w[redacted]d here for PPROM at 0900 on 06/08/17  Labor: Continue titrating IV Pit.  Prematurity: s/p BMZ x 2 Fetal Wellbeing:  Cat I Pain Control:  Per patient's request. Planning on epidural when in active labor Anticipated MOD:  SVD  Frederik Pear, MD 06/09/2017, 6:29 PM

## 2017-06-10 ENCOUNTER — Inpatient Hospital Stay (HOSPITAL_COMMUNITY): Payer: Medicaid Other | Admitting: Anesthesiology

## 2017-06-10 ENCOUNTER — Encounter: Payer: Self-pay | Admitting: *Deleted

## 2017-06-10 ENCOUNTER — Encounter (HOSPITAL_COMMUNITY): Payer: Self-pay | Admitting: *Deleted

## 2017-06-10 DIAGNOSIS — Z3A36 36 weeks gestation of pregnancy: Secondary | ICD-10-CM

## 2017-06-10 DIAGNOSIS — O42113 Preterm premature rupture of membranes, onset of labor more than 24 hours following rupture, third trimester: Secondary | ICD-10-CM

## 2017-06-10 LAB — OBSTETRIC PANEL, INCLUDING HIV
BASOS ABS: 0 10*3/uL (ref 0.0–0.2)
Basos: 0 %
EOS (ABSOLUTE): 0.2 10*3/uL (ref 0.0–0.4)
Eos: 2 %
HEMATOCRIT: 29.7 % — AB (ref 34.0–46.6)
HEMOGLOBIN: 10.1 g/dL — AB (ref 11.1–15.9)
HEP B S AG: NEGATIVE
HIV SCREEN 4TH GENERATION: NONREACTIVE
IMMATURE GRANULOCYTES: 1 %
Immature Grans (Abs): 0.1 10*3/uL (ref 0.0–0.1)
LYMPHS ABS: 1.9 10*3/uL (ref 0.7–3.1)
Lymphs: 19 %
MCH: 29.3 pg (ref 26.6–33.0)
MCHC: 34 g/dL (ref 31.5–35.7)
MCV: 86 fL (ref 79–97)
MONOS ABS: 0.8 10*3/uL (ref 0.1–0.9)
Monocytes: 8 %
Neutrophils Absolute: 7.1 10*3/uL — ABNORMAL HIGH (ref 1.4–7.0)
Neutrophils: 70 %
Platelets: 245 10*3/uL (ref 150–379)
RBC: 3.45 x10E6/uL — AB (ref 3.77–5.28)
RDW: 14.1 % (ref 12.3–15.4)
RH TYPE: NEGATIVE
RPR Ser Ql: NONREACTIVE
Rubella Antibodies, IGG: 1.33 index (ref >0.99)
WBC: 10 10*3/uL (ref 3.4–10.8)

## 2017-06-10 LAB — AB SCR+ANTIBODY ID
Antibody Screen: POSITIVE — AB
COOMBS TITER #2: 4
Coombs Titer #1: 256

## 2017-06-10 LAB — KLEIHAUER-BETKE STAIN
# Vials RhIg: 1
FETAL CELLS %: 0 %
QUANTITATION FETAL HEMOGLOBIN: 0 mL

## 2017-06-10 LAB — HEMOGLOBIN A1C
ESTIMATED AVERAGE GLUCOSE: 108 mg/dL
HEMOGLOBIN A1C: 5.4 % (ref 4.8–5.6)

## 2017-06-10 MED ORDER — EPHEDRINE 5 MG/ML INJ: 10.0000 mg | INTRAVENOUS | Status: DC | PRN

## 2017-06-10 MED ORDER — ZOLPIDEM TARTRATE 5 MG PO TABS: 5.0000 mg | ORAL_TABLET | Freq: Every evening | ORAL | Status: DC | PRN

## 2017-06-10 MED ORDER — ONDANSETRON HCL 4 MG/2ML IJ SOLN: 4.0000 mg | INTRAMUSCULAR | Status: DC | PRN

## 2017-06-10 MED ORDER — LACTATED RINGERS IV SOLN: 500.0000 mL | Freq: Once | INTRAVENOUS | Status: DC

## 2017-06-10 MED ORDER — DIPHENHYDRAMINE HCL 25 MG PO CAPS
25.0000 mg | ORAL_CAPSULE | Freq: Four times a day (QID) | ORAL | Status: DC | PRN
Start: 2017-06-10 — End: 2017-06-12

## 2017-06-10 MED ORDER — PRENATAL MULTIVITAMIN CH
1.0000 | ORAL_TABLET | Freq: Every day | ORAL | Status: DC
  Administered 2017-06-12: 1 via ORAL
  Filled 2017-06-10 (×2): qty 1

## 2017-06-10 MED ORDER — FENTANYL 2.5 MCG/ML BUPIVACAINE 1/10 % EPIDURAL INFUSION (WH - ANES)
14.0000 mL/h | INTRAMUSCULAR | Status: DC | PRN
  Administered 2017-06-10: 14 mL/h via EPIDURAL
  Filled 2017-06-10: qty 100

## 2017-06-10 MED ORDER — PHENYLEPHRINE 40 MCG/ML (10ML) SYRINGE FOR IV PUSH (FOR BLOOD PRESSURE SUPPORT)
80.0000 ug | PREFILLED_SYRINGE | INTRAVENOUS | Status: DC | PRN
  Filled 2017-06-10: qty 10

## 2017-06-10 MED ORDER — IBUPROFEN 600 MG PO TABS
600.0000 mg | ORAL_TABLET | Freq: Four times a day (QID) | ORAL | Status: DC
  Administered 2017-06-10 – 2017-06-11 (×4): 600 mg via ORAL
  Filled 2017-06-10 (×7): qty 1

## 2017-06-10 MED ORDER — LACTATED RINGERS IV SOLN
500.0000 mL | Freq: Once | INTRAVENOUS | Status: AC
  Administered 2017-06-10: 500 mL via INTRAVENOUS

## 2017-06-10 MED ORDER — IBUPROFEN 600 MG PO TABS
600.0000 mg | ORAL_TABLET | Freq: Four times a day (QID) | ORAL | Status: DC
  Administered 2017-06-10 – 2017-06-12 (×4): 600 mg via ORAL
  Filled 2017-06-10 (×3): qty 1

## 2017-06-10 MED ORDER — ACETAMINOPHEN 325 MG PO TABS: 650.0000 mg | ORAL_TABLET | ORAL | Status: DC | PRN

## 2017-06-10 MED ORDER — BENZOCAINE-MENTHOL 20-0.5 % EX AERO
1.0000 "application " | INHALATION_SPRAY | CUTANEOUS | Status: DC | PRN
  Administered 2017-06-10: 1 via TOPICAL
  Filled 2017-06-10 (×2): qty 56

## 2017-06-10 MED ORDER — SIMETHICONE 80 MG PO CHEW: 80.0000 mg | CHEWABLE_TABLET | ORAL | Status: DC | PRN

## 2017-06-10 MED ORDER — DIPHENHYDRAMINE HCL 50 MG/ML IJ SOLN: 12.5000 mg | INTRAMUSCULAR | Status: DC | PRN

## 2017-06-10 MED ORDER — RHO D IMMUNE GLOBULIN 1500 UNITS IM SOSY
1500.0000 [IU] | PREFILLED_SYRINGE | Freq: Once | INTRAMUSCULAR | Status: DC
  Filled 2017-06-10: qty 1

## 2017-06-10 MED ORDER — SENNOSIDES-DOCUSATE SODIUM 8.6-50 MG PO TABS
2.0000 | ORAL_TABLET | ORAL | Status: DC
  Administered 2017-06-10 – 2017-06-11 (×2): 2 via ORAL
  Filled 2017-06-10 (×2): qty 2

## 2017-06-10 MED ORDER — TETANUS-DIPHTH-ACELL PERTUSSIS 5-2.5-18.5 LF-MCG/0.5 IM SUSP
0.5000 mL | Freq: Once | INTRAMUSCULAR | Status: DC
  Filled 2017-06-10: qty 0.5

## 2017-06-10 MED ORDER — COCONUT OIL OIL: 1.0000 "application " | TOPICAL_OIL | Status: DC | PRN

## 2017-06-10 MED ORDER — DIBUCAINE 1 % RE OINT: 1.0000 "application " | TOPICAL_OINTMENT | RECTAL | Status: DC | PRN

## 2017-06-10 MED ORDER — PHENYLEPHRINE 40 MCG/ML (10ML) SYRINGE FOR IV PUSH (FOR BLOOD PRESSURE SUPPORT): 80.0000 ug | PREFILLED_SYRINGE | INTRAVENOUS | Status: DC | PRN

## 2017-06-10 MED ORDER — LIDOCAINE HCL (PF) 1 % IJ SOLN
INTRAMUSCULAR | Status: DC | PRN
  Administered 2017-06-10: 5 mL via EPIDURAL
  Administered 2017-06-10: 3 mL via EPIDURAL
  Administered 2017-06-10: 2 mL via EPIDURAL

## 2017-06-10 MED ORDER — WITCH HAZEL-GLYCERIN EX PADS: 1.0000 "application " | MEDICATED_PAD | CUTANEOUS | Status: DC | PRN

## 2017-06-10 MED ORDER — ONDANSETRON HCL 4 MG PO TABS: 4.0000 mg | ORAL_TABLET | ORAL | Status: DC | PRN

## 2017-06-10 NOTE — Anesthesia Preprocedure Evaluation (Signed)
Anesthesia Evaluation  Patient identified by MRN, date of birth, ID band Patient awake    Reviewed: Allergy & Precautions, NPO status , Patient's Chart, lab work & pertinent test results  Airway Mallampati: II  TM Distance: >3 FB Neck ROM: Full    Dental  (+) Teeth Intact, Dental Advisory Given   Pulmonary asthma , Current Smoker,    Pulmonary exam normal breath sounds clear to auscultation       Cardiovascular negative cardio ROS Normal cardiovascular exam Rhythm:Regular Rate:Normal     Neuro/Psych negative neurological ROS     GI/Hepatic negative GI ROS, Neg liver ROS,   Endo/Other  negative endocrine ROSObesity   Renal/GU negative Renal ROS     Musculoskeletal negative musculoskeletal ROS (+)   Abdominal   Peds  Hematology  (+) Blood dyscrasia, anemia , Plt 257k   Anesthesia Other Findings Day of surgery medications reviewed with the patient.  Reproductive/Obstetrics (+) Pregnancy                             Anesthesia Physical Anesthesia Plan  ASA: II  Anesthesia Plan: Epidural   Post-op Pain Management:    Induction:   PONV Risk Score and Plan: Treatment may vary due to age or medical condition  Airway Management Planned:   Additional Equipment:   Intra-op Plan:   Post-operative Plan:   Informed Consent: I have reviewed the patients History and Physical, chart, labs and discussed the procedure including the risks, benefits and alternatives for the proposed anesthesia with the patient or authorized representative who has indicated his/her understanding and acceptance.   Dental advisory given  Plan Discussed with:   Anesthesia Plan Comments: (Patient identified. Risks/Benefits/Options discussed with patient including but not limited to bleeding, infection, nerve damage, paralysis, failed block, incomplete pain control, headache, blood pressure changes, nausea,  vomiting, reactions to medication both or allergic, itching and postpartum back pain. Confirmed with bedside nurse the patient's most recent platelet count. Confirmed with patient that they are not currently taking any anticoagulation, have any bleeding history or any family history of bleeding disorders. Patient expressed understanding and wished to proceed. All questions were answered. )        Anesthesia Quick Evaluation

## 2017-06-10 NOTE — Lactation Note (Addendum)
This note was copied from a baby's chart. Lactation Consultation Note  Patient Name: Erin Tran [id number] Date: 06/10/2017 Reason for consult: Initial assessment;Late-preterm 34-36.6wks;NICU baby  Visited with P3 Mom of 9 hr old [redacted]w[redacted]d infant transferred to NICU.  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 Infant on triple phototherapy.  Mom set up with a DEBP, and instructed on importance of frequent double pumping (>8 times per 24 hrs) on initiation cycle.  Encouraged breast massage, and hand expression also.  If baby can go to the breast STS, talked about how beneficial this will be for her milk.   Mom has WIC, LC to fax request for [facility] pump on discharge.   NICU booklet, and Lactation brochure given to Mom.  Encouraged Mom to call for assistance as needed.  Mom denies any questions.   Mom aware of Lactation support available to her.  Interventions Interventions: DEBP  Lactation Tools Discussed/Used WIC Program: Yes Pump Review: Setup, frequency, and cleaning;Milk Storage Initiated by:: RN  Date initiated:: 06/10/17   Consult Status Consult Status: Follow-up Date: 06/11/17 Follow-up type: In-patient    Erin Tran 06/10/2017, 8:15 PM

## 2017-06-10 NOTE — Anesthesia Postprocedure Evaluation (Signed)
Anesthesia Post Note  Patient: DANICA CAMARENA  Procedure(s) Performed: AN AD HOC LABOR EPIDURAL     Patient location during evaluation: Mother Baby Anesthesia Type: Epidural Level of consciousness: awake and alert and oriented Pain management: satisfactory to patient Vital Signs Assessment: post-procedure vital signs reviewed and stable Respiratory status: respiratory function stable Cardiovascular status: stable Postop Assessment: no headache, no backache, epidural receding, patient able to bend at knees, no signs of nausea or vomiting and adequate PO intake Anesthetic complications: no    Last Vitals:  Vitals:   06/10/17 1259 06/10/17 1415  BP: (!) 117/59 (!) 116/56  Pulse: 87 90  Resp: 18 16  Temp: 37.2 C 37.1 C  SpO2:      Last Pain:  Vitals:   06/10/17 1415  TempSrc: Oral  PainSc:    Pain Goal:                 Jedd Schulenburg

## 2017-06-10 NOTE — Anesthesia Procedure Notes (Signed)
Epidural Patient location during procedure: OB Start time: 06/10/2017 8:44 AM End time: 06/10/2017 8:49 AM  Staffing Anesthesiologist: Cecile Hearing Performed: anesthesiologist   Preanesthetic Checklist Completed: patient identified, pre-op evaluation, timeout performed, IV checked, risks and benefits discussed and monitors and equipment checked  Epidural Patient position: sitting Prep: DuraPrep Patient monitoring: blood pressure and continuous pulse ox Approach: midline Location: L3-L4 Injection technique: LOR air  Needle:  Needle type: Tuohy  Needle gauge: 17 G Needle length: 9 cm Needle insertion depth: 6 cm Catheter size: 19 Gauge Catheter at skin depth: 11 cm Test dose: negative and Other (1% Lidocaine)  Additional Notes Patient identified.  Risk benefits discussed including failed block, incomplete pain control, headache, nerve damage, paralysis, blood pressure changes, nausea, vomiting, reactions to medication both toxic or allergic, and postpartum back pain.  Patient expressed understanding and wished to proceed.  All questions were answered.  Sterile technique used throughout procedure and epidural site dressed with sterile barrier dressing. No paresthesia or other complications noted. The patient did not experience any signs of intravascular injection such as tinnitus or metallic taste in mouth nor signs of intrathecal spread such as rapid motor block. Please see nursing notes for vital signs. Reason for block:procedure for pain

## 2017-06-10 NOTE — Progress Notes (Signed)
LABOR PROGRESS NOTE  Erin Tran is a 21 y.o. [id number] at [redacted]w[redacted]d admitted for PPROM  Subjective: Resting comfortably.   Objective: BP (!) 110/58   Pulse 82   Temp 98.4 F (36.9 C) (Oral)   Resp 16   Ht  (1.676 m)   Wt 102.1 kg (225 lb)   LMP 09/11/2016   BMI 36.32 kg/m  or  Vitals:   06/09/17 2056 06/09/17 2211 06/09/17 2323 06/10/17 0100  BP: (!) 93/47 (!) 117/49 (!) 106/39 (!) 110/58  Pulse: 87 84 89 82  Resp: Temp:  98.1 F (36.7 C)  98.4 F (36.9 C)  TempSrc:  Oral  Oral  Weight:      Height:        Dilation: 4 Effacement (%): 50 Cervical Position: Posterior Station: -2 Presentation: Vertex Exam by:: Dr. Doroteo Glassman FHT: baseline rate 145, moderate varibility, +acel, no decel Toco: ctx q4-32min   Labs: Lab Results  Component Value Date   WBC 9.2 06/08/2017   HGB 9.8 (L) 06/08/2017   HCT 29.5 (L) 06/08/2017   MCV 89.7 06/08/2017   PLT 257 06/08/2017    Patient Active Problem List   Diagnosis Date Noted  . Full-term premature rupture of membranes 06/08/2017  . Late prenatal care 06/03/2017  . Blood type, Rh negative 06/28/2015  . Supervision of high risk pregnancy, antepartum 06/27/2015  . Asthma, intermittent 10/29/2014    Assessment / Plan: 21 y.o. Erin Tran at [redacted]w[redacted]d here for PPROM at 0900 on 06/08/17. In latent labor.  Labor: IV pitocin started again s/p pit break. Continue to increase 2x2. IUPC in place.  Prematurity: s/p BMZ x 2 Fetal Wellbeing:  Cat I Pain Control:  Per patient's request. Planning on epidural when in active labor Anticipated MOD:  SVD  Caryl Ada, DO 06/10/2017, 3:38 AM

## 2017-06-10 NOTE — Progress Notes (Signed)
IUPC placed. Not much cervical change. Pitocin currently at 57mu/min. Will give pitocin break for 3 hrs.   Dilation: 4 Effacement (%): 50 Cervical Position: Posterior Station: -2 Presentation: Vertex Exam by:: Dr. Charlann Boxer, DO OB Fellow

## 2017-06-10 NOTE — Plan of Care (Signed)
Problem: Coping: Goal: Ability to identify and utilize available resources and services will improve Outcome: Progressing No PNC

## 2017-06-11 ENCOUNTER — Encounter: Payer: Self-pay | Admitting: Medical

## 2017-06-11 LAB — RH IG WORKUP (INCLUDES ABO/RH)
ABO/RH(D): O NEG
Gestational Age(Wks): 36
UNIT DIVISION: 0

## 2017-06-11 LAB — RUBELLA SCREEN: Rubella: 1.24 index (ref >0.99)

## 2017-06-11 NOTE — Lactation Note (Signed)
This note was copied from a baby's chart. Lactation Consultation Note  Patient Name: Erin Tran ZOXWR'UToday's Date: 06/11/2017 Reason for consult: Follow-up assessment;NICU baby;Late-preterm 34-36.6wks   Follow up with mom at infant bedside. Mom reports she is not pumping regularly. She reports she was not getting any volume. Reviewed supply and demand, milk coming to volume, and importance of pumping regularly to stimulate milk production. Enc mom to pump every 2-3 hours for 15- 20 minutes with DEBP and follow with hand expression. Mom reports she knows how to hand express. Mom reports she smokes sometimes, enc mom not to smoke for at least 30 minutes prior to pumping.  [facility] [place] [facility] [place] [facility]'s [facility] du to NICU admission.    Maternal Data Formula Feeding for Exclusion: No Has patient been taught Hand Expression?: Yes  Feeding Feeding Type: Formula Length of feed: 30 min  LATCH Score                   Interventions    Lactation Tools Discussed/Used WIC Program: Yes Pump Review: Setup, frequency, and cleaning;Milk Storage Initiated by:: Reviewed and encouraged every 2-3 hours   Consult Status Consult Status: Follow-up Date: 06/12/17 Follow-up type: In-patient    Erin Tran 06/11/2017, 3:22 PM

## 2017-06-11 NOTE — Progress Notes (Signed)
Post Partum Day 1  Subjective:  Erin Tran is a 21 y.o. [id number] 2105w2d s/p SVD.  No acute events overnight.  Pt denies problems with ambulating, voiding or po intake.  She denies nausea or vomiting.  Pain is well controlled. Lochia Small.  Plan for birth control is IUD.  Method of Feeding: Breast  Objective: BP 99/66 (BP Location: Left Arm)   Pulse 73   Temp 98.4 F (36.9 C) (Oral)   Resp 18   Ht 5\' 6"  (1.676 m)   Wt 102.1 kg (225 lb)   LMP 09/11/2016   SpO2 99%   Breastfeeding? Unknown   BMI 36.32 kg/m   Physical Exam:  General: alert, cooperative and no distress Lochia:normal flow Chest: normal work of breathing Heart: regular rate Abdomen: soft, nontender Uterine Fundus: firm DVT Evaluation: No evidence of DVT seen on physical exam.   Recent Labs  06/08/17 1243  HGB 9.8*  HCT 29.5*    Assessment/Plan:  ASSESSMENT: Erin Tran is a 21 y.o. [id number] 69105w2d ppd #1 s/p NSVD doing well.   Plan for discharge tomorrow and Breastfeeding Continue routine PP care Will not need Rhogam due to alloimmunization  Baby in NICU   LOS: 3 days   Caryl AdaJazma Ericah Scotto, DO 06/11/2017, 8:49 AM

## 2017-06-11 NOTE — Progress Notes (Signed)
CSW met with parents at baby's bedside to introduce services and offer support.  CSW arranged time to meet with parents to complete assessment in MOB's room for privacy.  CSW plans to meet with them tomorrow morning prior to discharge.  Parents agreeable.

## 2017-06-11 NOTE — Plan of Care (Signed)
Problem: Role Relationship: Goal: Ability to demonstrate positive interaction with newborn will improve Outcome: Completed/Met Date Met: 06/11/17 Mother visits NICU frequently throughout day to bond with infant

## 2017-06-12 LAB — TYPE AND SCREEN
ABO/RH(D): O NEG
ANTIBODY SCREEN: POSITIVE
DAT, IGG: NEGATIVE
DONOR AG TYPE: NEGATIVE
Donor AG Type: NEGATIVE
Unit division: 0
Unit division: 0

## 2017-06-12 LAB — BPAM RBC
BLOOD PRODUCT EXPIRATION DATE: 201811132359
Blood Product Expiration Date: 201811162359
UNIT TYPE AND RH: 9500
Unit Type and Rh: 9500

## 2017-06-12 MED ORDER — IBUPROFEN 600 MG PO TABS: 600.0000 mg | ORAL_TABLET | Freq: Four times a day (QID) | ORAL | 1 refills | Status: DC | PRN

## 2017-06-12 NOTE — Discharge Instructions (Signed)
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
## 2017-06-12 NOTE — Discharge Summary (Signed)
OB Discharge Summary     Patient Name: Erin Tran DOB: 1996/07/08 MRN: [id number]  Date of admission: 06/08/2017 Delivering MD: Rolm Bookbinder   Date of discharge: 06/12/2017  Admitting diagnosis: PPROM @ 36 weeks Intrauterine pregnancy: [redacted]w[redacted]d     Secondary diagnosis:    Additional problems: Rh negative and did not receive Rhogam, asthma, prenatal care x 1 visit @ 35wks     Discharge diagnosis: Preterm Pregnancy Delivered                                                                                                Post partum procedures:none, patient did not receive rhogam due to testing positive for antibodies  Augmentation: Pitocin  Complications: None  Hospital course:  Induction of Labor With Vaginal Delivery   21 y.o. yo [phone] at [redacted]w[redacted]d was admitted to the hospital 06/08/2017 for induction of labor.  Indication for induction: PPROM. She rec'd BMZ x 2 prior to delivery. Patient had an uncomplicated labor course as follows: Membrane Rupture Time/Date: 9:00 AM ,06/08/2017   Intrapartum Procedures: Episiotomy: None [1]                                         Lacerations:  None [1]  Patient had delivery of a Viable infant.  Information for the patient's newborn:  Faithlynn, Deeley [[id number]]  Delivery Method: Vaginal, Spontaneous Delivery (Filed from Delivery Summary)   06/10/2017  Details of delivery can be found in separate delivery note.  Patient had a routine postpartum course. Patient is discharged home 06/12/17.  Physical exam  Vitals:   06/10/17 1802 06/11/17 0623 06/11/17 1818 06/12/17 0608  BP: (!) 115/58 99/66 (!) 122/53 (!) 115/53  Pulse: 85 73 71 (!) 57  Resp: 18 18 18 16   Temp: 98.7 F (37.1 C) 98.4 F (36.9 C) (!) 97.5 F (36.4 C) 98.7 F (37.1 C)  TempSrc: Oral Oral Oral Oral  SpO2:      Weight:      Height:       General: alert Lochia: appropriate Uterine Fundus: firm Incision: N/A DVT Evaluation: No evidence of DVT seen on  physical exam. No cords or calf tenderness. Labs: Lab Results  Component Value Date   WBC 9.2 06/08/2017   HGB 9.8 (L) 06/08/2017   HCT 29.5 (L) 06/08/2017   MCV 89.7 06/08/2017   PLT 257 06/08/2017   CMP Latest Ref Rng & Units 03/06/2015  Glucose 65 - 99 mg/dL 89  BUN 6 - 20 mg/dL 7  Creatinine 1.30 - 8.65 mg/dL 7.84  Sodium 696 - 295 mmol/L 137  Potassium 3.5 - 5.1 mmol/L 3.9  Chloride 101 - 111 mmol/L 106  CO2 22 - 32 mmol/L 25  Calcium 8.9 - 10.3 mg/dL 2.8(U)  Total Protein 6.5 - 8.1 g/dL 6.6  Total Bilirubin 0.3 - 1.2 mg/dL 0.4  Alkaline Phos 38 - 126 U/L 77  AST 15 - 41 U/L 12(L)  ALT 14 - 54 U/L 10(L)  Discharge instruction: per After Visit Summary and "Baby and Me Booklet".  After visit meds:  Allergies as of 06/12/2017   No Known Allergies     Medication List    TAKE these medications   albuterol 108 (90 Base) MCG/ACT inhaler Commonly known as:  PROVENTIL HFA;VENTOLIN HFA Inhale 2 puffs into the lungs every 6 (six) hours as needed for wheezing or shortness of breath.   ibuprofen 600 MG tablet Commonly known as:  ADVIL,MOTRIN Take 1 tablet (600 mg total) by mouth every 6 (six) hours as needed.   prenatal multivitamin Tabs tablet Take 1 tablet by mouth daily at 12 noon.       Diet: routine diet  Activity: Advance as tolerated. Pelvic rest for 6 weeks.   Outpatient follow up:6 weeks Follow up Appt:No future appointments. Follow up Visit:No Follow-up on file.  Postpartum contraception: None- counseled re importance due to alloimmunization  Newborn Data: Live born female  Birth Weight: 6 lb 5.9 oz (2890 g) APGAR: 8, 9  Newborn Delivery   Birth date/time:  06/10/2017 11:14:00 Delivery type:  Vaginal, Spontaneous Delivery      Baby Feeding: Breast Disposition:NICU   06/12/2017 Arlyce Harmanimothy Lockamy, DO  CNM attestation I have seen and examined this patient and agree with above documentation in the resident's note.   Erin Tran is a  21 y.o. [phone] 8329G4P2113 s/p preterm SVD.   Pain is well controlled.  Plan for birth control is no method.  Method of Feeding: breast/pumping  PE:  BP (!) 115/53 (BP Location: Left Arm)   Pulse (!) 57   Temp 98.7 F (37.1 C) (Oral)   Resp 16   Ht 5\' 6"  (1.676 m)   Wt 102.1 kg (225 lb)   LMP 09/11/2016   SpO2 99%   Breastfeeding? Unknown   BMI 36.32 kg/m  Fundus firm  No results for input(s): HGB, HCT in the last 72 hours.   Plan: discharge today - postpartum care discussed - f/u clinic in 4 weeks for postpartum visit; PP msg sent re importance of pt using contraception due to increased risks to subsequent pregnancies due to alloimmunization   Cam HaiSHAW, KIMBERLY, CNM 9:02 AM 06/12/2017

## 2017-06-12 NOTE — Progress Notes (Signed)
CSW attempted to meet with parents, but they were not in MOB's room at this time.  CSW will attempt again at a later time.

## 2017-06-23 ENCOUNTER — Encounter (HOSPITAL_COMMUNITY): Payer: Self-pay

## 2017-06-23 DIAGNOSIS — F17210 Nicotine dependence, cigarettes, uncomplicated: Secondary | ICD-10-CM | POA: Diagnosis not present

## 2017-06-23 DIAGNOSIS — J45909 Unspecified asthma, uncomplicated: Secondary | ICD-10-CM | POA: Insufficient documentation

## 2017-06-23 DIAGNOSIS — H9202 Otalgia, left ear: Secondary | ICD-10-CM | POA: Diagnosis present

## 2017-06-23 DIAGNOSIS — H6692 Otitis media, unspecified, left ear: Secondary | ICD-10-CM | POA: Insufficient documentation

## 2017-06-24 ENCOUNTER — Emergency Department (HOSPITAL_COMMUNITY)
Admission: EM | Admit: 2017-06-24 | Discharge: 2017-06-24 | Disposition: A | Discharge status: Home / Self Care | Payer: Medicaid Other | Attending: Emergency Medicine | Admitting: Emergency Medicine

## 2017-06-24 DIAGNOSIS — H6692 Otitis media, unspecified, left ear: Secondary | ICD-10-CM

## 2017-06-24 MED ORDER — IBUPROFEN 400 MG PO TABS
800.0000 mg | ORAL_TABLET | Freq: Once | ORAL | Status: AC
  Administered 2017-06-24: 800 mg via ORAL
  Filled 2017-06-24: qty 2

## 2017-06-24 MED ORDER — AMOXICILLIN-POT CLAVULANATE 875-125 MG PO TABS
1.0000 | ORAL_TABLET | Freq: Once | ORAL | Status: AC
Start: 2017-06-24 — End: 2017-06-24
  Administered 2017-06-24: 1 via ORAL
  Filled 2017-06-24 (×2): qty 1

## 2017-06-24 MED ORDER — IBUPROFEN 600 MG PO TABS: 600.0000 mg | ORAL_TABLET | Freq: Four times a day (QID) | ORAL | 0 refills | Status: DC | PRN

## 2017-06-24 MED ORDER — CETIRIZINE HCL 10 MG PO TABS: 10.0000 mg | ORAL_TABLET | Freq: Every day | ORAL | 0 refills | Status: DC

## 2017-06-24 MED ORDER — AMOXICILLIN-POT CLAVULANATE 875-125 MG PO TABS: 1.0000 | ORAL_TABLET | Freq: Two times a day (BID) | ORAL | 0 refills | Status: DC

## 2017-06-24 NOTE — ED Notes (Signed)
sts developed left ear pain yesterday evening around 1700. sts c/o shooting pain and now can not hear out of left ear. Denies fevers/drainage

## 2017-06-24 NOTE — ED Provider Notes (Signed)
[facility]   CSN: [id number] Arrival date & time: 06/23/17  2307     History   Chief Complaint Chief Complaint  Patient presents with  . Otalgia    HPI Erin Tran is a [age] y.o. female.  The history is provided by the patient. No language interpreter was used.  Otalgia  This is a new problem. The current episode started yesterday. There is pain in the left ear. The problem occurs constantly. The problem has been gradually worsening. There has been no fever. The pain is moderate. Associated symptoms include hearing loss. Pertinent negatives include no ear discharge, no rhinorrhea, no sore throat, no neck pain and no rash.    Past Medical History:  Diagnosis Date  . Asthma   . Medical history non-contributory   . Pregnant     Patient Active Problem List   Diagnosis Date Noted  . Full-term premature rupture of membranes 06/08/2017  . Late prenatal care 06/03/2017  . Blood type, Rh negative 06/28/2015  . Supervision of high risk pregnancy, antepartum 06/27/2015  . Asthma, intermittent 10/29/2014    Past Surgical History:  Procedure Laterality Date  . NO PAST SURGERIES      OB History    Gravida Para Term Preterm AB Living   4 3 2 1 1 3    SAB TAB Ectopic Multiple Live Births   0 0 0 0 3       Home Medications    Prior to Admission medications   Medication Sig Start Date End Date Taking? Authorizing Provider  albuterol (PROVENTIL HFA;VENTOLIN HFA) 108 (90 Base) MCG/ACT inhaler Inhale 2 puffs into the lungs every 6 (six) hours as needed for wheezing or shortness of breath. 06/03/17   Reva BoresPratt, Tanya S, MD  amoxicillin-clavulanate (AUGMENTIN) 875-125 MG tablet Take 1 tablet by mouth every 12 (twelve) hours. 06/24/17   Antony MaduraHumes, Tayson Schnelle, PA-C  cetirizine (ZYRTEC ALLERGY) 10 MG tablet Take 1 tablet (10 mg total) by mouth daily. 06/24/17   Antony MaduraHumes, Derrion Tritz, PA-C  ibuprofen (ADVIL,MOTRIN) 600 MG tablet Take 1 tablet (600  mg total) by mouth every 6 (six) hours as needed for headache, mild pain or moderate pain. 06/24/17   Antony MaduraHumes, Jakyle Petrucelli, PA-C  Prenatal Vit-Fe Fumarate-FA (PRENATAL MULTIVITAMIN) TABS tablet Take 1 tablet by mouth daily at 12 noon.    [provider]    Family History Family History  Problem Relation Age of Onset  . Diabetes Maternal Grandmother   . Cancer Neg Hx   . Hypertension Neg Hx   . Hyperlipidemia Neg Hx   . Seizures Mother   . Stroke Neg Hx     Social History Social History  Substance Use Topics  . Smoking status: Current Every Day Smoker    Packs/day: 0.25    Types: Cigarettes  . Smokeless tobacco: Never Used  . Alcohol use No     Allergies   Patient has no known allergies.   Review of Systems Review of Systems  HENT: Positive for ear pain and hearing loss. Negative for ear discharge, rhinorrhea and sore throat.   Musculoskeletal: Negative for neck pain.  Skin: Negative for rash.  Ten systems reviewed and are negative for acute change, except as noted in the HPI.    Physical Exam Updated Vital Signs BP 119/67 (BP Location: Left Arm)   Pulse 78   Temp 98.4 F (36.9 C) (Oral)   Resp 18   LMP 09/11/2016   SpO2 99%  Breastfeeding? Yes   Physical Exam  Constitutional: She is oriented to person, place, and time. She appears well-developed and well-nourished. No distress.  Patient nontoxic-appearing and in no acute distress  HENT:  Head: Normocephalic and atraumatic.  Right Ear: Tympanic membrane, external ear and ear canal normal.  Left Ear: External ear and ear canal normal. Tympanic membrane is erythematous and bulging.  Mouth/Throat: Uvula is midline, oropharynx is clear and moist and mucous membranes are normal.  Eyes: Conjunctivae and EOM are normal. No scleral icterus.  Neck: Normal range of motion.  No nuchal rigidity or meningismus  Pulmonary/Chest: Effort normal. No respiratory distress.  Respirations even and unlabored  Musculoskeletal:  Normal range of motion.  Neurological: She is alert and oriented to person, place, and time.  Skin: Skin is warm and dry. No rash noted. She is not diaphoretic. No erythema. No pallor.  Psychiatric: She has a normal mood and affect. Her behavior is normal.  Nursing note and vitals reviewed.    ED Treatments / Results  Labs (all labs ordered are listed, but only abnormal results are displayed) Labs Reviewed - No data to display  EKG  EKG Interpretation None       Radiology No results found.  Procedures Procedures (including critical care time)  Medications Ordered in ED Medications  amoxicillin-clavulanate (AUGMENTIN) 875-125 MG per tablet 1 tablet (not administered)  ibuprofen (ADVIL,MOTRIN) tablet 800 mg (not administered)     Initial Impression / Assessment and Plan / ED Course  I have reviewed the triage vital signs and the nursing notes.  Pertinent labs & imaging results that were available during my care of the patient were reviewed by me and considered in my medical decision making (see chart for details).     Patient presents with otalgia and exam consistent with acute otitis media. No concern for acute mastoiditis, meningitis.  No antibiotic use in the last month.  Patient discharged home with Augmentin.  Advised PCP follow up. Return precautions discussed and provided. Patient discharged in stable condition with no unaddressed concerns.   Final Clinical Impressions(s) / ED Diagnoses   Final diagnoses:  Left otitis media, unspecified otitis media type    New Prescriptions New Prescriptions   AMOXICILLIN-CLAVULANATE (AUGMENTIN) 875-125 MG TABLET    Take 1 tablet by mouth every 12 (twelve) hours.   CETIRIZINE (ZYRTEC ALLERGY) 10 MG TABLET    Take 1 tablet (10 mg total) by mouth daily.     Antony Madura, PA-C 06/24/17 2956    Glynn Octave, MD 06/24/17 [phone]

## 2017-07-01 ENCOUNTER — Encounter: Payer: Self-pay | Admitting: General Practice

## 2017-07-15 ENCOUNTER — Ambulatory Visit: Payer: Self-pay | Admitting: Medical

## 2017-07-22 ENCOUNTER — Ambulatory Visit: Payer: Self-pay

## 2017-08-20 IMAGING — US US MFM OB COMP +14 WKS
1 series · 14 of 28 positions shown · non-contrast
Comparison: none

[Series 1: us mfm ob comp +14 wks · 80 acquisitions, 14 frames shown]
[im 3/80]
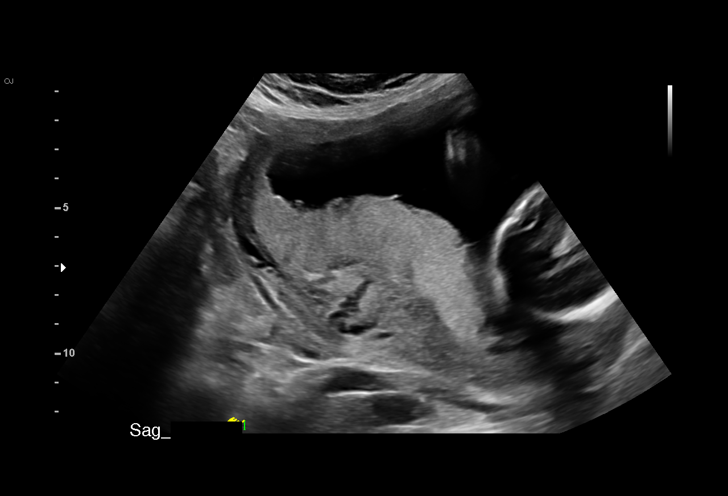
[im 9/80]
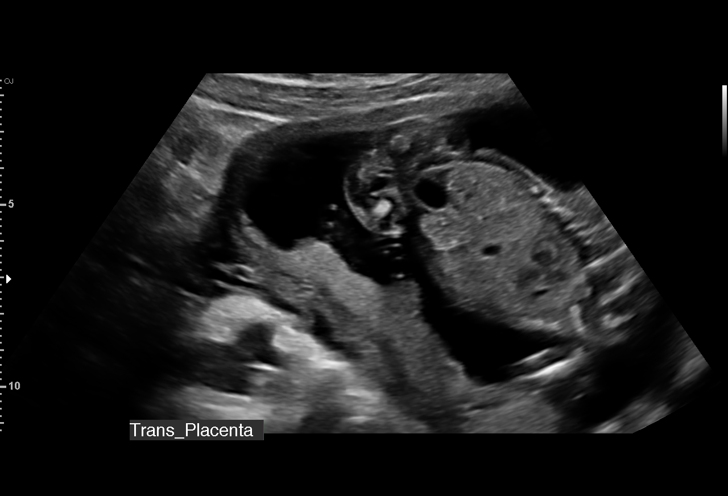
[im 15/80]
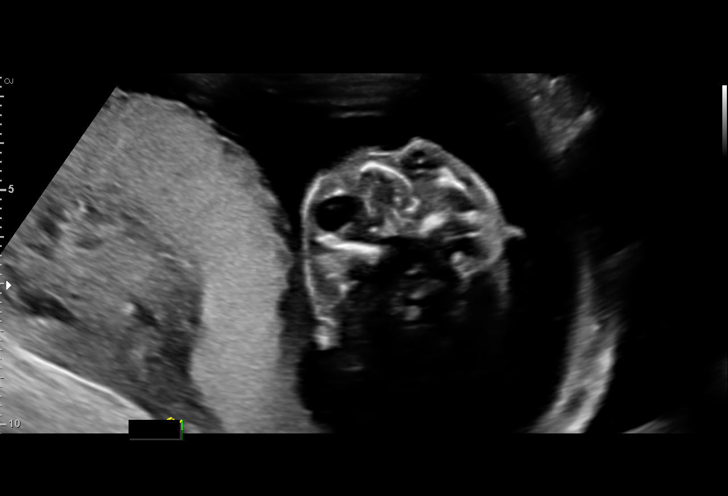
[im 21/80]
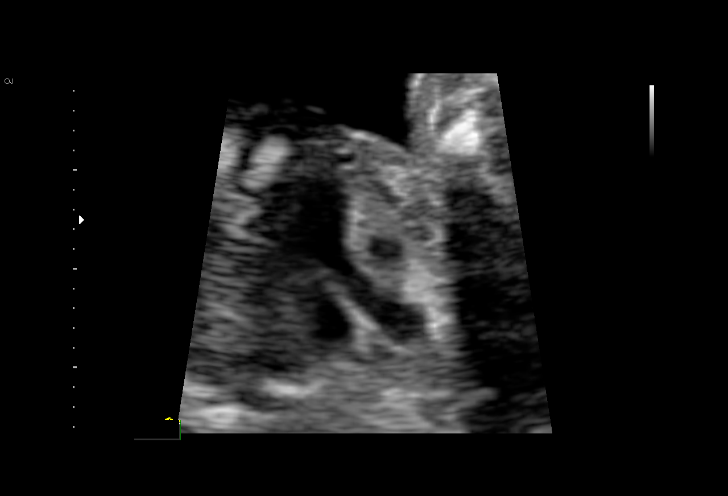
[im 27/80]
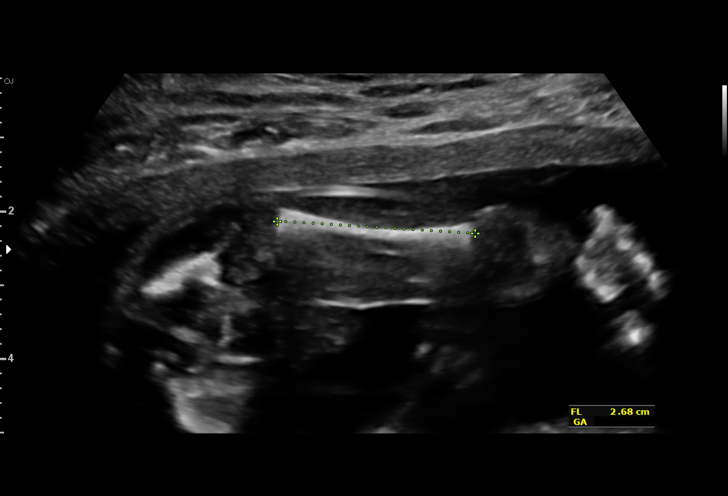
[im 33/80]
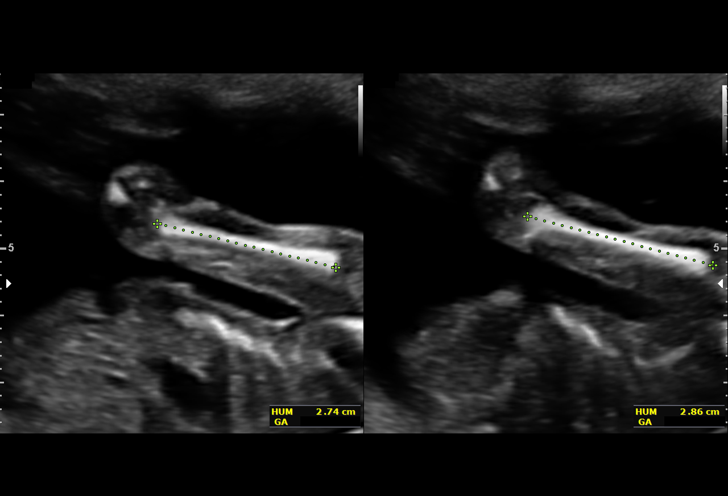
[im 39/80]
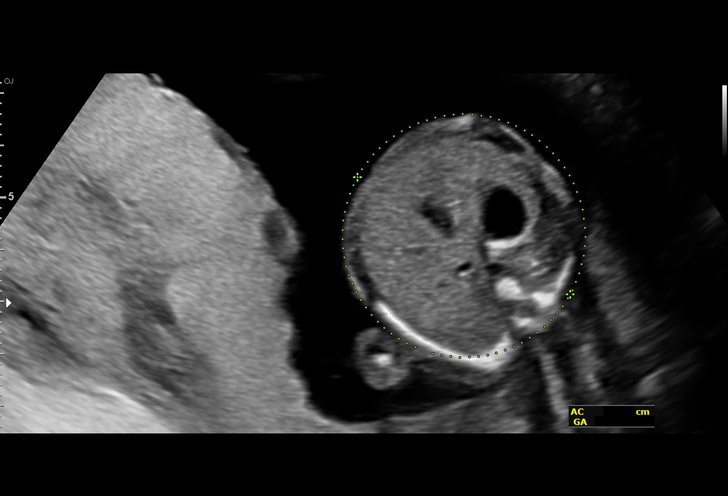
[im 44/80]
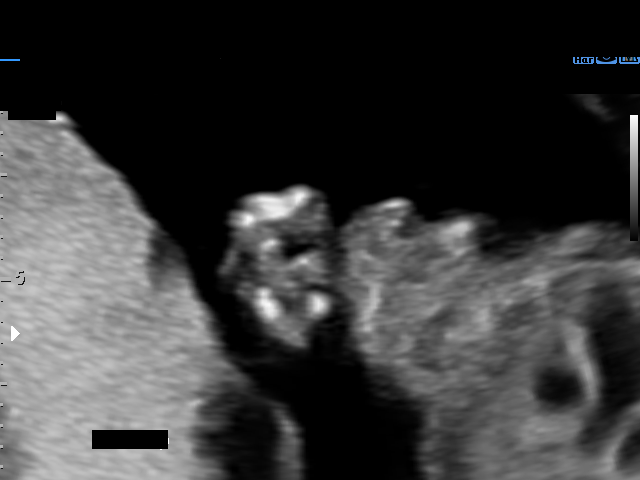
[im 50/80]
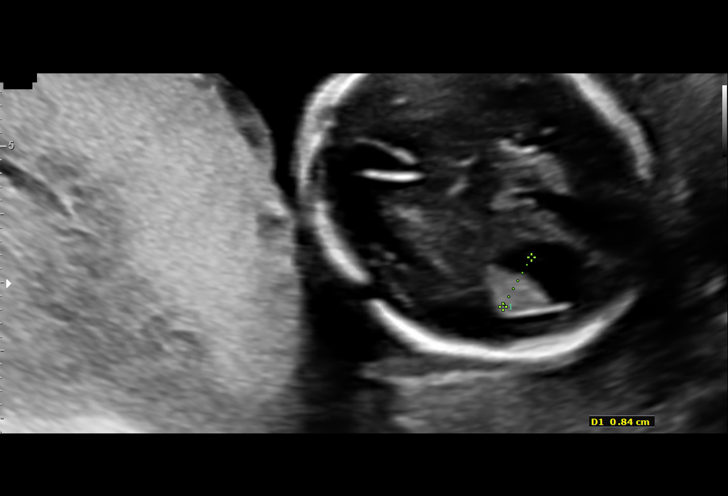
[im 56/80]
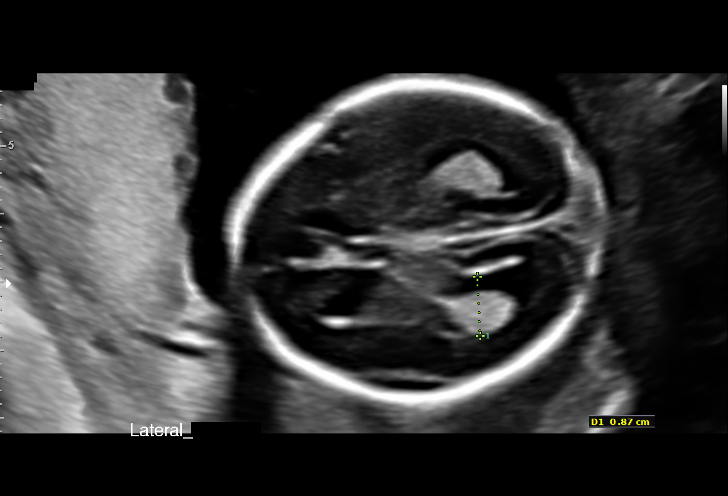
[im 62/80]
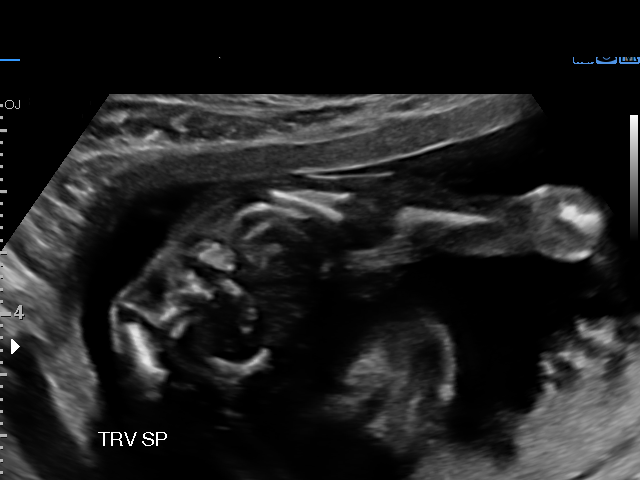
[im 68/80]
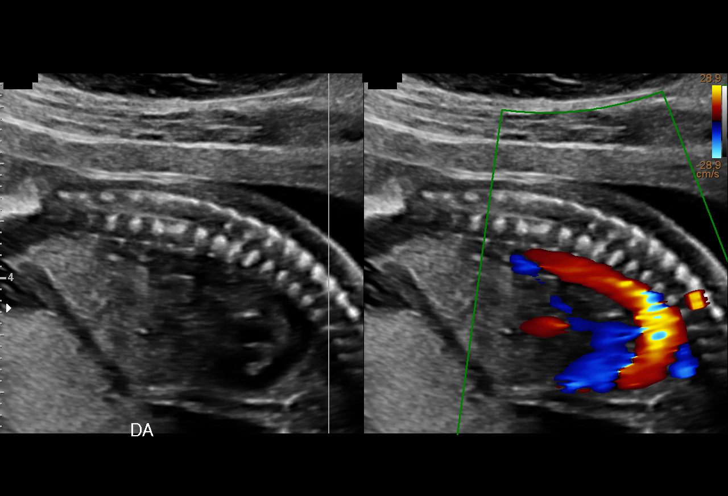
[im 74/80]
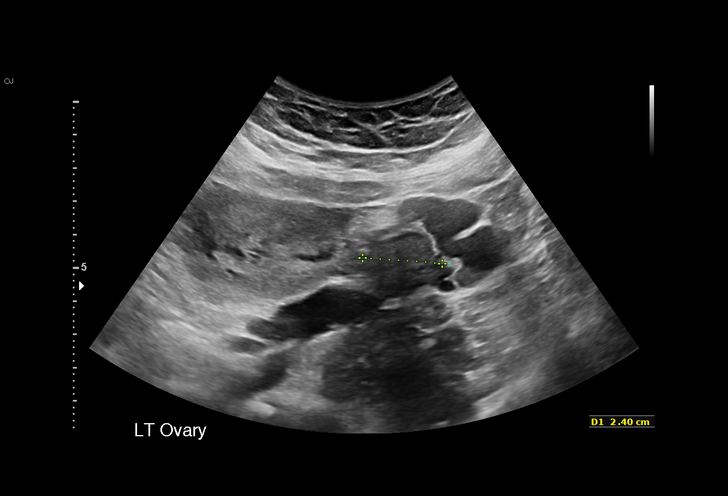
[im 80/80]
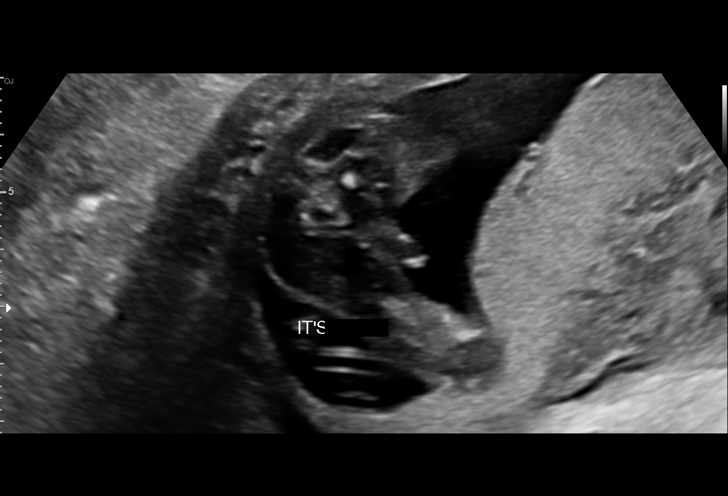

[14 of 28 positions shown; findings below may reference images not displayed]

1  NAZARETH JUMPER              [id number]      [id number]     [id number]
Indications

19 weeks gestation of pregnancy
Encounter for antenatal screening for
malformations
OB History

Blood Type:            Height:  5'6"   Weight (lb):  210       BMI:
Gravidity:    4         Term:   2        Prem:   0        SAB:   1
TOP:          0       Ectopic:  0        Living: 2
Fetal Evaluation

Num Of Fetuses:     1
Fetal Heart         162
Rate(bpm):
Cardiac Activity:   Observed
Presentation:       Cephalic
Placenta:           Posterior, above cervical os
P. Cord Insertion:  Visualized

Amniotic Fluid
AFI FV:      Subjectively within normal limits

Largest Pocket(cm)
3.5
Biometry

BPD:        46  mm     G. Age:  19w 6d         81  %    CI:        87.99   %    70 - 86
FL/HC:      17.3   %    16.1 -
HC:      154.3  mm     G. Age:  18w 3d         13  %    HC/AC:      1.04        1.09 -
AC:      148.1  mm     G. Age:  20w 1d         76  %    FL/BPD:     58.0   %
FL:       26.7  mm     G. Age:  18w 1d         13  %    FL/AC:      18.0   %    20 - 24
HUM:        28  mm     G. Age:  19w 0d         47  %
CER:      20.9  mm     G. Age:  19w 6d         66  %

CM:        4.8  mm
Est. FW:     275  gm    0 lb 10 oz      45  %
Gestational Age

LMP:           13w 1d        Date:  11/07/16                 EDD:   08/14/17
U/S Today:     19w 1d                                        EDD:   07/03/17
Best:          19w 1d     Det. By:  U/S (02/07/17)           EDD:   07/03/17
Anatomy

Cranium:               Appears normal         Aortic Arch:            Appears normal
Cavum:                 Appears normal         Ductal Arch:            Appears normal
Ventricles:            Appears normal         Diaphragm:              Appears normal
Choroid Plexus:        Appears normal         Stomach:                Appears normal, left
sided
Cerebellum:            Appears normal         Abdomen:                Appears normal
Posterior Fossa:       Appears normal         Abdominal Wall:         Appears nml (cord
insert, abd wall)
Nuchal Fold:           Appears normal         Cord Vessels:           Appears normal (3
vessel cord)
Face:                  Appears normal         Kidneys:                Appear normal
(orbits and profile)
Lips:                  Appears normal         Bladder:                Appears normal
Thoracic:              Appears normal         Spine:                  Appears normal
Heart:                 Appears normal         Upper Extremities:      Appears normal
(4CH, axis, and situs
RVOT:                  Appears normal         Lower Extremities:      Appears normal
LVOT:                  Appears normal

Other:  Male gender. Heels visualized.
Cervix Uterus Adnexa

Cervix
Length:            3.5  cm.
Normal appearance by transabdominal scan.

Uterus
No abnormality visualized.

Left Ovary
Within normal limits.

Right Ovary
Within normal limits.
Impression

Single IUP at 19w 1d by today's study
Normal fetal anatomic survey
Posterior placenta without previa
Normal amniotic fluid volume
Recommendations

Follow-up ultrasounds as clinically indicated.

## 2018-07-29 ENCOUNTER — Other Ambulatory Visit: Payer: Self-pay

## 2018-07-29 ENCOUNTER — Encounter (HOSPITAL_COMMUNITY): Payer: Self-pay | Admitting: Emergency Medicine

## 2018-07-29 ENCOUNTER — Inpatient Hospital Stay (HOSPITAL_COMMUNITY)
Admission: AD | Admit: 2018-07-29 | Discharge: 2018-07-29 | Disposition: A | Discharge status: Home / Self Care | Payer: Medicaid Other | Attending: Obstetrics & Gynecology | Admitting: Obstetrics & Gynecology

## 2018-07-29 DIAGNOSIS — R102 Pelvic and perineal pain: Secondary | ICD-10-CM | POA: Insufficient documentation

## 2018-07-29 DIAGNOSIS — F17210 Nicotine dependence, cigarettes, uncomplicated: Secondary | ICD-10-CM | POA: Insufficient documentation

## 2018-07-29 DIAGNOSIS — O3680X0 Pregnancy with inconclusive fetal viability, not applicable or unspecified: Secondary | ICD-10-CM

## 2018-07-29 DIAGNOSIS — O26899 Other specified pregnancy related conditions, unspecified trimester: Secondary | ICD-10-CM

## 2018-07-29 DIAGNOSIS — Z3201 Encounter for pregnancy test, result positive: Secondary | ICD-10-CM | POA: Insufficient documentation

## 2018-07-29 LAB — URINALYSIS, ROUTINE W REFLEX MICROSCOPIC
GLUCOSE, UA: NEGATIVE mg/dL
KETONES UR: 20 mg/dL — AB
Nitrite: POSITIVE — AB
PH: 5 (ref 5.0–8.0)
PROTEIN: 30 mg/dL — AB
Specific Gravity, Urine: 1.032 — ABNORMAL HIGH (ref 1.005–1.030)

## 2018-07-29 LAB — POCT PREGNANCY, URINE: Preg Test, Ur: POSITIVE — AB

## 2018-07-29 NOTE — MAU Provider Note (Signed)
History     CSN: [id number]  Arrival date and time: 07/29/18 7829   First Provider Initiated Contact with Patient 07/29/18 1938      Chief Complaint  Patient presents with  . Pelvic Pain   Pelvic Pain  The patient's primary symptoms include pelvic pain. The patient's pertinent negatives include no vaginal discharge. This is a new problem. Episode onset: 2 days  The problem occurs constantly. The problem has been unchanged. Pain severity now: 6/10. The problem affects the right side. She is pregnant. Pertinent negatives include no chills, dysuria, fever, frequency, nausea or vomiting. The vaginal discharge was normal. There has been no bleeding. Nothing aggravates the symptoms. She has tried NSAIDs for the symptoms. The treatment provided mild relief. Her menstrual history has been regular (06/26/18 approx, but uncertain. ).    OB History    Gravida  4   Para  3   Term  2   Preterm  1   AB  1   Living  3     SAB  0   TAB  0   Ectopic  0   Multiple  0   Live Births  3           Past Medical History:  Diagnosis Date  . Asthma   . Medical history non-contributory   . Pregnant     Past Surgical History:  Procedure Laterality Date  . NO PAST SURGERIES      Family History  Problem Relation Age of Onset  . Diabetes Maternal Grandmother   . Cancer Neg Hx   . Hypertension Neg Hx   . Hyperlipidemia Neg Hx   . Seizures Mother   . Stroke Neg Hx     Social History   Tobacco Use  . Smoking status: Current Every Day Smoker    Packs/day: 0.25    Types: Cigarettes  . Smokeless tobacco: Never Used  Substance Use Topics  . Alcohol use: No  . Drug use: No    Allergies: No Known Allergies  Medications Prior to Admission  Medication Sig Dispense Refill Last Dose  . albuterol (PROVENTIL HFA;VENTOLIN HFA) 108 (90 Base) MCG/ACT inhaler Inhale 2 puffs into the lungs every 6 (six) hours as needed for wheezing or shortness of breath. 1 Inhaler 2 Past Month at  Unknown time  . amoxicillin-clavulanate (AUGMENTIN) 875-125 MG tablet Take 1 tablet by mouth every 12 (twelve) hours. 14 tablet 0   . cetirizine (ZYRTEC ALLERGY) 10 MG tablet Take 1 tablet (10 mg total) by mouth daily. 15 tablet 0   . ibuprofen (ADVIL,MOTRIN) 600 MG tablet Take 1 tablet (600 mg total) by mouth every 6 (six) hours as needed for headache, mild pain or moderate pain. 20 tablet 0   . Prenatal Vit-Fe Fumarate-FA (PRENATAL MULTIVITAMIN) TABS tablet Take 1 tablet by mouth daily at 12 noon.   06/07/2017 at Unknown time    Review of Systems  Constitutional: Negative for chills and fever.  Gastrointestinal: Negative for nausea and vomiting.  Genitourinary: Positive for pelvic pain. Negative for dysuria, frequency, vaginal bleeding and vaginal discharge.   Physical Exam   Blood pressure 120/66, pulse 94, temperature 98.3 F (36.8 C), temperature source Oral, resp. rate 18, weight 94.4 kg, last menstrual period 07/22/2018, currently breastfeeding.  Physical Exam  Nursing note and vitals reviewed. Constitutional: She is oriented to person, place, and time. She appears well-developed and well-nourished. No distress.  HENT:  Head: Normocephalic.  Cardiovascular: Normal rate.  Respiratory: Effort  normal.  GI: Soft. There is no tenderness. There is no rebound.  Neurological: She is alert and oriented to person, place, and time.  Skin: Skin is warm and dry.  Psychiatric: She has a normal mood and affect.   Results for orders placed or performed during the hospital encounter of 07/29/18 (from the past 24 hour(s))  Pregnancy, urine POC     Status: Abnormal   Collection Time: 07/29/18  7:23 PM  Result Value Ref Range   Preg Test, Ur POSITIVE (A) NEGATIVE    MAU Course  Procedures  MDM Patient is here with a young child under one year. Advised patient that children under 12 cannot go to US. In order to fully evaluate her pain and r/o an ectopic pregnancy then an US is indicated at  this time. Patient became upset and asked my why we couldn't watch her child for her or allow her child in US. Explained that we cannot due to hospital policy. Patient then walked off the unit.   Assessment and Plan   1. Pelvic pain in pregnancy   2. Pregnancy of unknown anatomic location     Patient left AMA   Thressa ShellerHeather Rhonna Holster 07/29/2018, 7:39 PM

## 2018-07-29 NOTE — MAU Note (Signed)
Pt presents to MAU c/o sever abdominal pain x2 days. Pt states it is so severe that it is almost hard to get out of bed. Pt denies vaginal bleeding or discharge. Pt states her LMP was around the 27th of November and it was a normal period for her.

## 2018-08-10 ENCOUNTER — Other Ambulatory Visit: Payer: Self-pay

## 2018-08-10 ENCOUNTER — Encounter (HOSPITAL_COMMUNITY): Payer: Self-pay | Admitting: *Deleted

## 2018-08-10 ENCOUNTER — Inpatient Hospital Stay (HOSPITAL_COMMUNITY)
Admission: AD | Admit: 2018-08-10 | Discharge: 2018-08-10 | Disposition: A | Discharge status: Home / Self Care | Payer: Self-pay | Attending: Obstetrics and Gynecology | Admitting: Obstetrics and Gynecology

## 2018-08-10 DIAGNOSIS — O99331 Smoking (tobacco) complicating pregnancy, first trimester: Secondary | ICD-10-CM | POA: Insufficient documentation

## 2018-08-10 DIAGNOSIS — O26891 Other specified pregnancy related conditions, first trimester: Secondary | ICD-10-CM | POA: Insufficient documentation

## 2018-08-10 DIAGNOSIS — F17210 Nicotine dependence, cigarettes, uncomplicated: Secondary | ICD-10-CM | POA: Insufficient documentation

## 2018-08-10 DIAGNOSIS — B9689 Other specified bacterial agents as the cause of diseases classified elsewhere: Secondary | ICD-10-CM | POA: Insufficient documentation

## 2018-08-10 DIAGNOSIS — Z3201 Encounter for pregnancy test, result positive: Secondary | ICD-10-CM

## 2018-08-10 DIAGNOSIS — O23591 Infection of other part of genital tract in pregnancy, first trimester: Secondary | ICD-10-CM | POA: Insufficient documentation

## 2018-08-10 DIAGNOSIS — R109 Unspecified abdominal pain: Secondary | ICD-10-CM | POA: Insufficient documentation

## 2018-08-10 DIAGNOSIS — Z3A01 Less than 8 weeks gestation of pregnancy: Secondary | ICD-10-CM | POA: Insufficient documentation

## 2018-08-10 DIAGNOSIS — R1012 Left upper quadrant pain: Secondary | ICD-10-CM

## 2018-08-10 LAB — CBC WITH DIFFERENTIAL/PLATELET
BASOS PCT: 1 %
Basophils Absolute: 0 10*3/uL (ref 0.0–0.1)
EOS ABS: 0.2 10*3/uL (ref 0.0–0.5)
EOS PCT: 2 %
HCT: 38 % (ref 36.0–46.0)
HEMOGLOBIN: 12.4 g/dL (ref 12.0–15.0)
Lymphocytes Relative: 26 %
Lymphs Abs: 2.2 10*3/uL (ref 0.7–4.0)
MCH: 30.6 pg (ref 26.0–34.0)
MCHC: 32.6 g/dL (ref 30.0–36.0)
MCV: 93.8 fL (ref 80.0–100.0)
Monocytes Absolute: 0.3 10*3/uL (ref 0.1–1.0)
Monocytes Relative: 4 %
NEUTROS PCT: 67 %
Neutro Abs: 5.8 10*3/uL (ref 1.7–7.7)
Platelets: 398 10*3/uL (ref 150–400)
RBC: 4.05 MIL/uL (ref 3.87–5.11)
RDW: 13.7 % (ref 11.5–15.5)
WBC: 8.5 10*3/uL (ref 4.0–10.5)
nRBC: 0 % (ref 0.0–0.2)

## 2018-08-10 LAB — URINALYSIS, ROUTINE W REFLEX MICROSCOPIC
Bilirubin Urine: NEGATIVE
Glucose, UA: NEGATIVE mg/dL
Hgb urine dipstick: NEGATIVE
KETONES UR: NEGATIVE mg/dL
LEUKOCYTES UA: NEGATIVE
NITRITE: NEGATIVE
PROTEIN: NEGATIVE mg/dL
Specific Gravity, Urine: >1.03 — ABNORMAL HIGH (ref 1.005–1.030)
pH: 6 (ref 5.0–8.0)

## 2018-08-10 LAB — TYPE AND SCREEN
ABO/RH(D): O NEG
Antibody Screen: POSITIVE

## 2018-08-10 LAB — HCG, QUANTITATIVE, PREGNANCY: HCG, BETA CHAIN, QUANT, S: 6771 m[IU]/mL — AB (ref <5)

## 2018-08-10 LAB — WET PREP, GENITAL
Sperm: NONE SEEN
Trich, Wet Prep: NONE SEEN
Yeast Wet Prep HPF POC: NONE SEEN

## 2018-08-10 MED ORDER — PRENATAL MULTIVITAMIN CH: 1.0000 | ORAL_TABLET | Freq: Every day | ORAL | 11 refills | Status: AC

## 2018-08-10 MED ORDER — METRONIDAZOLE 500 MG PO TABS: 500.0000 mg | ORAL_TABLET | Freq: Two times a day (BID) | ORAL | 0 refills | Status: DC

## 2018-08-10 NOTE — MAU Note (Signed)
Been having stomach pains near the rib area on left side and in lower abd. This is an ongoing problem, has gotten worse.

## 2018-08-10 NOTE — Discharge Instructions (Signed)

## 2018-08-10 NOTE — MAU Note (Signed)
Pt states that she has to go, she will come back for an U/S.  Informed pt's U/S is done on emergent basis, if she is having complications.  Pt states she needs to find out how far along she is so she will know if she can have an abortion.

## 2018-08-10 NOTE — MAU Provider Note (Signed)
History     CSN: [id number]  Arrival date and time: 08/10/18 1139   First Provider Initiated Contact with Patient 08/10/18 1305      Chief Complaint  Patient presents with  . Abdominal Pain   HPI Erin Tran is a 22 y.o. [phone] at 2342w3d by LMP who presents with chief complaint of abdominal pain. This is a recurring problem, onset three weeks ago. She presented to MAU for evaluation but was unable to stay due to not having an adult to care for her toddler. Patient states "It felt like 1,000 knives were stabbing me in my left side".  She endorses improved but consistent pain, left midabdominal lateral to midaxillary line. She denies UTI symptoms, constipation, fever, lower abdominal pain.  She has not taken medication or tried other treatments.  Patient was surprised to learn she was pregnant today. She does not use contraception and was unaware her period was late until triaged in MAU. She denies vaginal bleeding, abnormal vaginal discharge, lower abdominal pain.  Patient is very concerned about being evaluated quickly.  She arrived to MAU at 1139 and expected to be discharged by 1pm. Her father is providing her transportation and needs to be at work by 2pm. She declines ultrasound today due to concerns about time.  OB History    Gravida  5   Para  3   Term  2   Preterm  1   AB  1   Living  3     SAB  0   TAB  0   Ectopic  0   Multiple  0   Live Births  3           Past Medical History:  Diagnosis Date  . Asthma     Past Surgical History:  Procedure Laterality Date  . WISDOM TOOTH EXTRACTION      Family History  Problem Relation Age of Onset  . Diabetes Maternal Grandmother   . Cancer Neg Hx   . Hypertension Neg Hx   . Hyperlipidemia Neg Hx   . Seizures Mother   . Stroke Neg Hx     Social History   Tobacco Use  . Smoking status: Current Every Day Smoker    Packs/day: 0.25    Types: Cigarettes  . Smokeless tobacco: Never Used   Substance Use Topics  . Alcohol use: No  . Drug use: No    Allergies: No Known Allergies  Medications Prior to Admission  Medication Sig Dispense Refill Last Dose  . albuterol (PROVENTIL HFA;VENTOLIN HFA) 108 (90 Base) MCG/ACT inhaler Inhale 2 puffs into the lungs every 6 (six) hours as needed for wheezing or shortness of breath. 1 Inhaler 2 Past Month at Unknown time  . amoxicillin-clavulanate (AUGMENTIN) 875-125 MG tablet Take 1 tablet by mouth every 12 (twelve) hours. 14 tablet 0   . cetirizine (ZYRTEC ALLERGY) 10 MG tablet Take 1 tablet (10 mg total) by mouth daily. 15 tablet 0   . ibuprofen (ADVIL,MOTRIN) 600 MG tablet Take 1 tablet (600 mg total) by mouth every 6 (six) hours as needed for headache, mild pain or moderate pain. 20 tablet 0   . Prenatal Vit-Fe Fumarate-FA (PRENATAL MULTIVITAMIN) TABS tablet Take 1 tablet by mouth daily at 12 noon.   06/07/2017 at Unknown time    Review of Systems  Constitutional: Negative for chills, fatigue and fever.  Gastrointestinal: Positive for abdominal pain. Negative for nausea and vomiting.  Genitourinary: Negative for difficulty urinating, dyspareunia, dysuria,  vaginal bleeding, vaginal discharge and vaginal pain.  Neurological: Negative for headaches.  All other systems reviewed and are negative.  Physical Exam   Blood pressure 128/66, pulse 75, temperature 98.2 F (36.8 C), temperature source Oral, resp. rate 17, weight 109.4 kg, last menstrual period 06/26/2018, SpO2 100 %, currently breastfeeding.  Physical Exam  Nursing note and vitals reviewed. Constitutional: She is oriented to person, place, and time. She appears well-developed and well-nourished.  Cardiovascular: Normal rate, normal heart sounds and intact distal pulses.  Respiratory: Effort normal and breath sounds normal.  GI: Soft. Bowel sounds are normal. She exhibits no distension. There is no abdominal tenderness. There is no rebound and no guarding.  Genitourinary:     No vaginal discharge.   Neurological: She is alert and oriented to person, place, and time.  Skin: Skin is warm and dry.  Psychiatric: She has a normal mood and affect. Her behavior is normal. Judgment and thought content normal.    MAU Course/MDM   --Blind swab used for vaginal specimen collection --Records reviewed, no history of ectopic --Reviewed ectopic and first trimester precautions with patient as well as plan for outpatient viability scan within the next week --Antibody positive result discussed with Dr. Vergie Living --Per patient request, she was discharged prior to receipt of all lab results. She was called and Antibody screen and BV treatment were discussed. Patient verbalized understanding --Spoke with Kiersten in [facility] who confirmed Antibody identification is in work    Patient Vitals for the past 24 hrs:  BP Temp Temp src Pulse Resp SpO2 Weight  08/10/18 1206 128/66 98.2 F (36.8 C) Oral 75 17 100 % 109.4 kg    Results for orders placed or performed during the hospital encounter of 08/10/18 (from the past 24 hour(s))  Urinalysis, Routine w reflex microscopic     Status: Abnormal   Collection Time: 08/10/18 12:50 PM  Result Value Ref Range   Color, Urine YELLOW YELLOW   APPearance HAZY (A) CLEAR   Specific Gravity, Urine >1.030 (H) 1.005 - 1.030   pH 6.0 5.0 - 8.0   Glucose, UA NEGATIVE NEGATIVE mg/dL   Hgb urine dipstick NEGATIVE NEGATIVE   Bilirubin Urine NEGATIVE NEGATIVE   Ketones, ur NEGATIVE NEGATIVE mg/dL   Protein, ur NEGATIVE NEGATIVE mg/dL   Nitrite NEGATIVE NEGATIVE   Leukocytes, UA NEGATIVE NEGATIVE  CBC with Differential/Platelet     Status: None   Collection Time: 08/10/18  1:18 PM  Result Value Ref Range   WBC 8.5 4.0 - 10.5 K/uL   RBC 4.05 3.87 - 5.11 MIL/uL   Hemoglobin 12.4 12.0 - 15.0 g/dL   HCT 16.1 09.6 - 04.5 %   MCV 93.8 80.0 - 100.0 fL   MCH 30.6 26.0 - 34.0 pg   MCHC 32.6 30.0 - 36.0 g/dL   RDW 40.9 81.1 - 91.4 %   Platelets 398  150 - 400 K/uL   nRBC 0.0 0.0 - 0.2 %   Neutrophils Relative % 67 %   Neutro Abs 5.8 1.7 - 7.7 K/uL   Lymphocytes Relative 26 %   Lymphs Abs 2.2 0.7 - 4.0 K/uL   Monocytes Relative 4 %   Monocytes Absolute 0.3 0.1 - 1.0 K/uL   Eosinophils Relative 2 %   Eosinophils Absolute 0.2 0.0 - 0.5 K/uL   Basophils Relative 1 %   Basophils Absolute 0.0 0.0 - 0.1 K/uL  hCG, quantitative, pregnancy     Status: Abnormal   Collection Time: 08/10/18  1:18 PM  Result Value Ref Range   hCG, Beta Chain, Quant, S 6,771 (H) <5 mIU/mL  Type and screen [facility] [place]     Status: None (Preliminary result)   Collection Time: 08/10/18  1:18 PM  Result Value Ref Range   ABO/RH(D) O NEG    Antibody Screen POS    Sample Expiration      08/13/2018 Performed at [facility], [address], [place], [place] [address]    Antibody Identification PENDING   Wet prep, genital     Status: Abnormal   Collection Time: 08/10/18  1:27 PM  Result Value Ref Range   Yeast Wet Prep HPF POC NONE SEEN NONE SEEN   Trich, Wet Prep NONE SEEN NONE SEEN   Clue Cells Wet Prep HPF POC PRESENT (A) NONE SEEN   WBC, Wet Prep HPF POC FEW (A) NONE SEEN   Sperm NONE SEEN    Meds ordered this encounter  Medications  . Prenatal Vit-Fe Fumarate-FA (PRENATAL MULTIVITAMIN) TABS tablet    Sig: Take 1 tablet by mouth daily at 12 noon.    Dispense:  30 tablet    Refill:  11    Order Specific Question:   Supervising Provider    Answer:   Reva Bores [[id number]]  . metroNIDAZOLE (FLAGYL) 500 MG tablet    Sig: Take 1 tablet (500 mg total) by mouth 2 (two) times daily.    Dispense:  14 tablet    Refill:  0    Order Specific Question:   Supervising Provider    Answer:   Reva Bores [[id number]]   Assessment and Plan  -22 y.o. [id number] at [redacted]w[redacted]d by LMP --Outpatient viability scan ordered. Patient knows to expect scheduling phone call --Bacterial Vaginosis, rx to pharmacy --O NEG, Antibody Positive. No bleeding. Rhogam not  indicated --Discharge home in stable condition with ectopic and bleeding precautions  F/U: Patient plans to pursue [facility]  [place], [place] 08/10/2018, 3:23 PM

## 2018-08-11 ENCOUNTER — Encounter: Payer: Self-pay | Admitting: Advanced Practice Midwife

## 2018-08-11 DIAGNOSIS — O360110 Maternal care for anti-D [Rh] antibodies, first trimester, not applicable or unspecified: Secondary | ICD-10-CM

## 2018-08-11 HISTORY — DX: Maternal care for anti-d (rh) antibodies, first trimester, not applicable or unspecified: O36.0110

## 2018-08-11 LAB — GC/CHLAMYDIA PROBE AMP (~~LOC~~) NOT AT ARMC
Chlamydia: NEGATIVE
NEISSERIA GONORRHEA: NEGATIVE

## 2018-08-13 ENCOUNTER — Ambulatory Visit (HOSPITAL_COMMUNITY): Payer: Self-pay | Attending: Advanced Practice Midwife

## 2018-08-26 NOTE — L&D Delivery Note (Addendum)
OB/GYN Faculty Practice Delivery Note  Erin Tran is a 23 y.o. [id number] s/p SVD at [redacted]w[redacted]d. She was admitted for IOL secondary to post-dates.  ROM: 5h 37m with clear fluid GBS Status: negative   Maximum Maternal Temperature: 36.7 C  Labor Progress: . Patient presented to L&D for IOL secondary to post-dates. Initial SVE: 2.5/thick/-3. Cytotec placed. AROM performed, epidural placed and Pitocin started. She then progressed to complete.   Delivery Date/Time: 04/09/2019 at 2353 Delivery: Called to room and patient was complete and pushing. Head delivered in OA position and restituted to LOA. No nuchal cord present. Shoulder and body delivered in usual fashion. Infant with spontaneous cry, placed on mother's abdomen, dried and stimulated. Cord clamped x 2 after 1-minute delay, and cut by mother. Cord blood drawn. Placenta did not deliver spontaneously with gentle cord traction and was ultimately removed manually after partial cord avulsion. Fundus firm with massage and Pitocin. Labia, perineum, vagina, and cervix inspected inspected with small vaginal abrasion which was hemostatic. Zosyn initiated for 24 hours post-partum due to manual extraction of placenta.  Placenta: Sent to L&D Complications: Partial cord avulsion Lacerations: small vaginal abrasion EBL: 250 mL  Analgesia: Epidural    Postpartum Planning [ ]  message to sent to schedule follow-up  [ ]  vaccines UTD  Infant: APGAR (1 MIN): 9   APGAR (5 MINS): 10   APGAR (10 MINS):     Barrington Ellison, MD [facility] Fellow, [facility] for [facility] 04/10/2019, 12:38 AM

## 2018-09-11 ENCOUNTER — Telehealth: Payer: Self-pay

## 2018-09-11 NOTE — Telephone Encounter (Signed)
In error

## 2019-04-03 ENCOUNTER — Inpatient Hospital Stay (HOSPITAL_BASED_OUTPATIENT_CLINIC_OR_DEPARTMENT_OTHER): Payer: Medicaid Other

## 2019-04-03 ENCOUNTER — Other Ambulatory Visit: Payer: Self-pay

## 2019-04-03 ENCOUNTER — Inpatient Hospital Stay (HOSPITAL_COMMUNITY)
Admission: AD | Admit: 2019-04-03 | Discharge: 2019-04-03 | Disposition: A | Discharge status: Home / Self Care | Payer: Medicaid Other | Attending: Family Medicine | Admitting: Family Medicine

## 2019-04-03 ENCOUNTER — Encounter (HOSPITAL_COMMUNITY): Payer: Self-pay

## 2019-04-03 DIAGNOSIS — Z3A40 40 weeks gestation of pregnancy: Secondary | ICD-10-CM

## 2019-04-03 DIAGNOSIS — O479 False labor, unspecified: Secondary | ICD-10-CM

## 2019-04-03 DIAGNOSIS — R10819 Abdominal tenderness, unspecified site: Secondary | ICD-10-CM

## 2019-04-03 DIAGNOSIS — O480 Post-term pregnancy: Secondary | ICD-10-CM | POA: Diagnosis not present

## 2019-04-03 DIAGNOSIS — O0933 Supervision of pregnancy with insufficient antenatal care, third trimester: Secondary | ICD-10-CM | POA: Diagnosis not present

## 2019-04-03 DIAGNOSIS — O0930 Supervision of pregnancy with insufficient antenatal care, unspecified trimester: Secondary | ICD-10-CM

## 2019-04-03 NOTE — MAU Note (Signed)
Erin Tran is a 23 y.o. at [redacted]w[redacted]d here in MAU reporting: started having some bleeding and pain yesterday. Reports she is no longer bleeding but is still having pain. States she is only having pain when she is standing or moving. +FM, no LOF.  Has not been getting PNC  Onset of complaint: yesterday  Pain score: 5/10  Vitals:   04/03/19 1819 04/03/19 1820  BP: 137/61   Pulse: 78   Resp: 18   Temp: 98.6 F (37 C)   SpO2:  98%      Lab orders placed from triage: none

## 2019-04-03 NOTE — Discharge Instructions (Signed)

## 2019-04-04 ENCOUNTER — Other Ambulatory Visit: Payer: Self-pay | Admitting: Student

## 2019-04-05 ENCOUNTER — Telehealth: Payer: Self-pay | Admitting: Family Medicine

## 2019-04-05 ENCOUNTER — Other Ambulatory Visit: Payer: Self-pay

## 2019-04-05 ENCOUNTER — Telehealth (HOSPITAL_COMMUNITY): Payer: Self-pay | Admitting: *Deleted

## 2019-04-05 LAB — CULTURE, BETA STREP (GROUP B ONLY)

## 2019-04-05 NOTE — Telephone Encounter (Signed)
Preadmission screen  

## 2019-04-05 NOTE — Telephone Encounter (Signed)
Attempted to reach patient about an appointment. Number listed in Epic was not related to patient. The person answering the phone requested her number be removed.

## 2019-04-06 ENCOUNTER — Telehealth (HOSPITAL_COMMUNITY): Payer: Self-pay | Admitting: *Deleted

## 2019-04-06 ENCOUNTER — Other Ambulatory Visit: Payer: Self-pay | Admitting: Advanced Practice Midwife

## 2019-04-06 NOTE — Telephone Encounter (Signed)
Preadmission screen  

## 2019-04-07 ENCOUNTER — Other Ambulatory Visit (HOSPITAL_COMMUNITY)
Admission: RE | Admit: 2019-04-07 | Discharge: 2019-04-07 | Disposition: A | Discharge status: Home / Self Care | Payer: Self-pay | Source: Ambulatory Visit

## 2019-04-09 ENCOUNTER — Inpatient Hospital Stay (HOSPITAL_COMMUNITY)
Admission: AD | Admit: 2019-04-09 | Discharge: 2019-04-11 | DRG: 807 | Disposition: A | Discharge status: Home / Self Care | Payer: Medicaid Other | Attending: Obstetrics & Gynecology | Admitting: Obstetrics & Gynecology

## 2019-04-09 ENCOUNTER — Inpatient Hospital Stay (HOSPITAL_COMMUNITY): Payer: Medicaid Other | Admitting: Anesthesiology

## 2019-04-09 ENCOUNTER — Inpatient Hospital Stay (HOSPITAL_COMMUNITY): Payer: Medicaid Other

## 2019-04-09 ENCOUNTER — Encounter (HOSPITAL_COMMUNITY): Payer: Self-pay

## 2019-04-09 ENCOUNTER — Other Ambulatory Visit: Payer: Self-pay

## 2019-04-09 DIAGNOSIS — O9902 Anemia complicating childbirth: Secondary | ICD-10-CM | POA: Diagnosis present

## 2019-04-09 DIAGNOSIS — O480 Post-term pregnancy: Principal | ICD-10-CM | POA: Diagnosis present

## 2019-04-09 DIAGNOSIS — Z6791 Unspecified blood type, Rh negative: Secondary | ICD-10-CM

## 2019-04-09 DIAGNOSIS — Z87891 Personal history of nicotine dependence: Secondary | ICD-10-CM | POA: Diagnosis not present

## 2019-04-09 DIAGNOSIS — O9952 Diseases of the respiratory system complicating childbirth: Secondary | ICD-10-CM | POA: Diagnosis present

## 2019-04-09 DIAGNOSIS — Z20828 Contact with and (suspected) exposure to other viral communicable diseases: Secondary | ICD-10-CM | POA: Diagnosis present

## 2019-04-09 DIAGNOSIS — D649 Anemia, unspecified: Secondary | ICD-10-CM | POA: Diagnosis present

## 2019-04-09 DIAGNOSIS — Z3A41 41 weeks gestation of pregnancy: Secondary | ICD-10-CM

## 2019-04-09 DIAGNOSIS — J4520 Mild intermittent asthma, uncomplicated: Secondary | ICD-10-CM | POA: Diagnosis present

## 2019-04-09 DIAGNOSIS — O26893 Other specified pregnancy related conditions, third trimester: Secondary | ICD-10-CM | POA: Diagnosis present

## 2019-04-09 DIAGNOSIS — O360130 Maternal care for anti-D [Rh] antibodies, third trimester, not applicable or unspecified: Secondary | ICD-10-CM | POA: Diagnosis not present

## 2019-04-09 LAB — CBC
HCT: 31.1 % — ABNORMAL LOW (ref 36.0–46.0)
Hemoglobin: 10.1 g/dL — ABNORMAL LOW (ref 12.0–15.0)
MCH: 29.7 pg (ref 26.0–34.0)
MCHC: 32.5 g/dL (ref 30.0–36.0)
MCV: 91.5 fL (ref 80.0–100.0)
Platelets: 261 10*3/uL (ref 150–400)
RBC: 3.4 MIL/uL — ABNORMAL LOW (ref 3.87–5.11)
RDW: 14.1 % (ref 11.5–15.5)
WBC: 9.4 10*3/uL (ref 4.0–10.5)
nRBC: 0 % (ref 0.0–0.2)

## 2019-04-09 LAB — SARS CORONAVIRUS 2 BY RT PCR (HOSPITAL ORDER, PERFORMED IN ~~LOC~~ HOSPITAL LAB): SARS Coronavirus 2: NEGATIVE

## 2019-04-09 LAB — PREPARE RBC (CROSSMATCH)

## 2019-04-09 LAB — RAPID URINE DRUG SCREEN, HOSP PERFORMED
Amphetamines: NOT DETECTED
Barbiturates: NOT DETECTED
Benzodiazepines: NOT DETECTED
Cocaine: NOT DETECTED
Opiates: NOT DETECTED
Tetrahydrocannabinol: POSITIVE — AB

## 2019-04-09 LAB — HEPATITIS B SURFACE ANTIGEN: Hepatitis B Surface Ag: NEGATIVE

## 2019-04-09 MED ORDER — ACETAMINOPHEN 325 MG PO TABS: 650.0000 mg | ORAL_TABLET | ORAL | Status: DC | PRN

## 2019-04-09 MED ORDER — FENTANYL-BUPIVACAINE-NACL 0.5-0.125-0.9 MG/250ML-% EP SOLN
12.0000 mL/h | EPIDURAL | Status: DC | PRN
  Filled 2019-04-09: qty 250

## 2019-04-09 MED ORDER — OXYTOCIN BOLUS FROM INFUSION
500.0000 mL | Freq: Once | INTRAVENOUS | Status: AC
  Administered 2019-04-09: 500 mL via INTRAVENOUS

## 2019-04-09 MED ORDER — EPHEDRINE 5 MG/ML INJ: 10.0000 mg | INTRAVENOUS | Status: DC | PRN

## 2019-04-09 MED ORDER — PHENYLEPHRINE 40 MCG/ML (10ML) SYRINGE FOR IV PUSH (FOR BLOOD PRESSURE SUPPORT)
80.0000 ug | PREFILLED_SYRINGE | INTRAVENOUS | Status: DC | PRN
  Filled 2019-04-09: qty 10

## 2019-04-09 MED ORDER — ONDANSETRON HCL 4 MG/2ML IJ SOLN: 4.0000 mg | Freq: Four times a day (QID) | INTRAMUSCULAR | Status: DC | PRN

## 2019-04-09 MED ORDER — SOD CITRATE-CITRIC ACID 500-334 MG/5ML PO SOLN: 30.0000 mL | ORAL | Status: DC | PRN

## 2019-04-09 MED ORDER — OXYTOCIN 40 UNITS IN NORMAL SALINE INFUSION - SIMPLE MED
1.0000 m[IU]/min | INTRAVENOUS | Status: DC
  Administered 2019-04-09: 2 m[IU]/min via INTRAVENOUS

## 2019-04-09 MED ORDER — LACTATED RINGERS IV SOLN: 500.0000 mL | Freq: Once | INTRAVENOUS | Status: DC

## 2019-04-09 MED ORDER — DIPHENHYDRAMINE HCL 50 MG/ML IJ SOLN: 12.5000 mg | INTRAMUSCULAR | Status: DC | PRN

## 2019-04-09 MED ORDER — PHENYLEPHRINE 40 MCG/ML (10ML) SYRINGE FOR IV PUSH (FOR BLOOD PRESSURE SUPPORT): 80.0000 ug | PREFILLED_SYRINGE | INTRAVENOUS | Status: DC | PRN

## 2019-04-09 MED ORDER — LACTATED RINGERS IV SOLN: 500.0000 mL | INTRAVENOUS | Status: DC | PRN

## 2019-04-09 MED ORDER — OXYTOCIN 40 UNITS IN NORMAL SALINE INFUSION - SIMPLE MED
2.5000 [IU]/h | INTRAVENOUS | Status: DC
  Filled 2019-04-09: qty 1000

## 2019-04-09 MED ORDER — TERBUTALINE SULFATE 1 MG/ML IJ SOLN: 0.2500 mg | Freq: Once | INTRAMUSCULAR | Status: DC | PRN

## 2019-04-09 MED ORDER — SODIUM CHLORIDE (PF) 0.9 % IJ SOLN
INTRAMUSCULAR | Status: DC | PRN
  Administered 2019-04-09: 12 mL/h via EPIDURAL

## 2019-04-09 MED ORDER — LIDOCAINE HCL (PF) 1 % IJ SOLN
INTRAMUSCULAR | Status: DC | PRN
  Administered 2019-04-09: 11 mL via EPIDURAL

## 2019-04-09 MED ORDER — LACTATED RINGERS IV SOLN
INTRAVENOUS | Status: DC
  Administered 2019-04-09 (×3): via INTRAVENOUS

## 2019-04-09 MED ORDER — MISOPROSTOL 25 MCG QUARTER TABLET
25.0000 ug | ORAL_TABLET | ORAL | Status: DC | PRN
  Administered 2019-04-09 (×2): 25 ug via VAGINAL
  Filled 2019-04-09 (×2): qty 1

## 2019-04-09 MED ORDER — OXYCODONE-ACETAMINOPHEN 5-325 MG PO TABS: 1.0000 | ORAL_TABLET | ORAL | Status: DC | PRN

## 2019-04-09 MED ORDER — LIDOCAINE HCL (PF) 1 % IJ SOLN: 30.0000 mL | INTRAMUSCULAR | Status: DC | PRN

## 2019-04-09 MED ORDER — OXYCODONE-ACETAMINOPHEN 5-325 MG PO TABS: 2.0000 | ORAL_TABLET | ORAL | Status: DC | PRN

## 2019-04-09 NOTE — Progress Notes (Signed)
LABOR PROGRESS NOTE  Erin Tran is a [age] y.o. [phone] at [redacted]w[redacted]d  admitted for PD IOL.  Subjective: Feels well comfortable and denies pressure or pain.  Objective: BP 120/64   Pulse 70   Temp 98 F (36.7 C) (Oral)   Resp 20   Ht 5\' 6"  (1.676 m)   Wt 104.3 kg   LMP 06/26/2018   BMI 37.12 kg/m  or  Vitals:   04/09/19 1932 04/09/19 2002 04/09/19 2032 04/09/19 2102  BP: 129/74 125/66 120/61 120/64  Pulse: 71 65 68 70  Resp: 18 18 18 20   Temp:      TempSrc:      Weight:      Height:        Gen: NAD Pulm: Comfortable room air Dilation: 5 Effacement (%): 80 Cervical Position: Middle Station: -1 Presentation: Vertex Exam by: Dr. Marice Potter  FHT: baseline rate 135, moderate varibility, +acel Toco: q2-3  Labs: Lab Results  Component Value Date   WBC 9.4 04/09/2019   HGB 10.1 (L) 04/09/2019   HCT 31.1 (L) 04/09/2019   MCV 91.5 04/09/2019   PLT 261 04/09/2019    Patient Active Problem List   Diagnosis Date Noted  . Indication for care in labor and delivery, antepartum 04/09/2019  . 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 Full-term premature rupture of membranes 06/08/2017  . Late prenatal care 06/03/2017  . Blood type, Rh negative 06/28/2015  . Supervision of high risk pregnancy, antepartum 06/27/2015  . Asthma, intermittent 10/29/2014    Assessment / Plan: Erin Tran at [redacted]w[redacted]d here for PD IOL.  Labor: s/p miso/AROM, regular contraction pattern, cont regular cervical checks. Will augment with Pitocin. Fetal Wellbeing:  Cat I Pain Control:  Epidural  Anticipated MOD:  NSVD  Erin Mann, MD/[facility] OB Fellow  04/09/2019, 9:14 PM

## 2019-04-09 NOTE — H&P (Signed)
OBSTETRIC ADMISSION HISTORY AND PHYSICAL  Erin Tran is a 23 y.o. female [phone] with IUP at 2632w0d by LMP presenting for IOL secondary to post-dates. She reports +FMs, No LOF, no VB, no blurry vision, headaches or peripheral edema, and RUQ pain. She has started to feel a few contractions since Cytotec was placed. She plans on both breast and bottle feeding. She requests possible BTL for birth control but does not have insurance and will decide about MOC after delivery.   She did not receive prenatal care.  Dating: By LMP --->  Estimated Date of Delivery: 04/02/19  Sono: 04/03/2019  Num Of Fetuses:          1  Fetal Heart Rate(bpm):   124  Cardiac Activity:        Observed  Presentation:            Cephalic  Amniotic Fluid  AFI FV:      Within normal limits  AFI Sum(cm)     %Tile       Largest Pocket(cm)  6               < 3         5.8   Prenatal History/Complications: - No PNC - O neg - Previous term SVD's without complication per patient; largest baby 8 lbs 6 oz  Past Medical History: Past Medical History:  Diagnosis Date  . Asthma     Past Surgical History: Past Surgical History:  Procedure Laterality Date  . WISDOM TOOTH EXTRACTION      Obstetrical History: OB History    Gravida  5   Para  3   Term  2   Preterm  1   AB  1   Living  3     SAB  0   TAB  0   Ectopic  0   Multiple  0   Live Births  3           Social History: Social History   Socioeconomic History  . Marital status: Single    Spouse name: Not on file  . Number of children: Not on file  . Years of education: Not on file  . Highest education level: Not on file  Occupational History    Employer: [employer]    Comment: Quit when she found out she was pregnant  Social Needs  . Financial resource strain: Not hard at all  . Food insecurity    Worry: Never true    Inability: Never true  . Transportation needs    Medical: No    Non-medical: No  Tobacco Use  . Smoking  status: Former Smoker    Packs/day: 0.25    Types: Cigarettes  . Smokeless tobacco: Never Used  Substance and Sexual Activity  . Alcohol use: No  . Drug use: No  . Sexual activity: Yes    Birth control/protection: None  Lifestyle  . Physical activity    Days per week: 0 days    Minutes per session: 0 min  . Stress: Not at all  Relationships  . Social Musicianconnections    Talks on phone: Not on file    Gets together: Not on file    Attends religious service: Not on file    Active member of club or organization: Not on file    Attends meetings of clubs or organizations: Not on file    Relationship status: Not on file  Other Topics Concern  . Not  on file  Social History Narrative   ** Merged History Encounter **       Ms. Locastro lives with her mother in [place]. FOB of 2nd pregnancy is the same FOB for her 21-year old son and he lives in [place]. They are not on good terms and she is unsure whether or not he will be involved in her son's life. She denies smoke ex   posure, alcohol, or other drug use. She previously worked at [employer] but quit when she found out she was pregnant.    Family History: Family History  Problem Relation Age of Onset  . Diabetes Maternal Grandmother   . Cancer Neg Hx   . Hypertension Neg Hx   . Hyperlipidemia Neg Hx   . Seizures Mother   . Stroke Neg Hx     Allergies: No Known Allergies  Medications Prior to Admission  Medication Sig Dispense Refill Last Dose  . Prenatal Vit-Fe Fumarate-FA (PRENATAL MULTIVITAMIN) TABS tablet Take 1 tablet by mouth daily at 12 noon. 30 tablet 11 04/08/2019 at Unknown time  . albuterol (PROVENTIL HFA;VENTOLIN HFA) 108 (90 Base) MCG/ACT inhaler Inhale 2 puffs into the lungs every 6 (six) hours as needed for wheezing or shortness of breath. 1 Inhaler 2   . cetirizine (ZYRTEC ALLERGY) 10 MG tablet Take 1 tablet (10 mg total) by mouth daily. 15 tablet 0   . metroNIDAZOLE (FLAGYL) 500 MG tablet Take 1 tablet (500 mg  total) by mouth 2 (two) times daily. 14 tablet 0      Review of Systems   All systems reviewed and negative except as stated in HPI  Blood pressure 123/70, pulse 85, temperature 98.1 F (36.7 C), temperature source Oral, resp. rate 16, height 5\' 6"  (1.676 m), weight 104.3 kg, last menstrual period 06/26/2018, currently breastfeeding. General appearance: alert, cooperative, appears stated age, no distress and morbidly obese Lungs: normal effort Heart: regular rate  Abdomen: soft, non-tender; bowel sounds normal Pelvic: gravid uterus  Extremities: Homans sign is negative, no sign of DVT Presentation: cephalic Fetal monitoringBaseline: 130 bpm, Variability: Good {> 6 bpm), Accelerations: Reactive and Decelerations: Absent Uterine activityNone Dilation: 2.5 Effacement (%): Thick Station: -3 Exam by:: S Nix RN   Prenatal labs: ABO, Rh: --/--/O NEG (08/14 4235) Antibody: POS (08/14 3614) Rubella:   RPR:    HBsAg:    HIV:    GBS:     Prenatal Transfer Tool  Maternal Diabetes: unknown Genetic Screening: Declined Significant Maternal Lab Results: O neg, Ab pos  Results for orders placed or performed during the hospital encounter of 04/09/19 (from the past 24 hour(s))  SARS Coronavirus 2 [facility] order, Performed in [facility] lab) Nasopharyngeal Nasopharyngeal Swab   Collection Time: 04/09/19  7:50 AM   Specimen: Nasopharyngeal Swab  Result Value Ref Range   SARS Coronavirus 2 NEGATIVE NEGATIVE  CBC   Collection Time: 04/09/19  8:12 AM  Result Value Ref Range   WBC 9.4 4.0 - 10.5 K/uL   RBC 3.40 (L) 3.87 - 5.11 MIL/uL   Hemoglobin 10.1 (L) 12.0 - 15.0 g/dL   HCT 31.1 (L) 36.0 - 46.0 %   MCV 91.5 80.0 - 100.0 fL   MCH 29.7 26.0 - 34.0 pg   MCHC 32.5 30.0 - 36.0 g/dL   RDW 14.1 11.5 - 15.5 %   Platelets 261 150 - 400 K/uL   nRBC 0.0 0.0 - 0.2 %  Type and screen   Collection Time: 04/09/19  8:12 AM  Result Value Ref Range   ABO/RH(D) O NEG    Antibody  Screen POS    Sample Expiration      04/12/2019,2359 Performed at [facility], [address] [place], [place] [address]     Patient Active Problem List   Diagnosis Date Noted  . Indication for care in labor and delivery, antepartum 04/09/2019  . 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 Full-term premature rupture of membranes 06/08/2017  . Late prenatal care 06/03/2017  . Blood type, Rh negative 06/28/2015  . Supervision of high risk pregnancy, antepartum 06/27/2015  . Asthma, intermittent 10/29/2014    Assessment/Plan:  Erin Tran is a 23 y.o. [id number] at 6844w0d here for IOL secondary to post-dates.  #Labor: s/p Cytotec, will hold 2 units PRBC's for O neg/Ab pos #Pain: Likely desires epidural eventually #FWB: Cat I; EFW: 3900 g #ID:  GBS neg #MOF: Both #MOC: Would like BTL but does not have insurance, further discuss post-partum #Circ:  [place] gender   Jerilynn Birkenheadhelsea Lewanda Perea, MD [facility] Family [facility] Fellow, [facility], [facility] 04/09/2019, 10:43 AM

## 2019-04-09 NOTE — Anesthesia Preprocedure Evaluation (Signed)
Anesthesia Evaluation  Patient identified by MRN, date of birth, ID band Patient awake    Reviewed: Allergy & Precautions, NPO status , Patient's Chart, lab work & pertinent test results  Airway Mallampati: II  TM Distance: >3 FB Neck ROM: Full    Dental  (+) Teeth Intact, Dental Advisory Given   Pulmonary asthma , Current Smoker, former smoker,    Pulmonary exam normal breath sounds clear to auscultation       Cardiovascular negative cardio ROS Normal cardiovascular exam Rhythm:Regular Rate:Normal     Neuro/Psych negative neurological ROS     GI/Hepatic negative GI ROS, Neg liver ROS,   Endo/Other  negative endocrine ROSObesity   Renal/GU negative Renal ROS     Musculoskeletal negative musculoskeletal ROS (+)   Abdominal   Peds  Hematology  (+) Blood dyscrasia, anemia , Plt 257k   Anesthesia Other Findings Day of surgery medications reviewed with the patient.  Reproductive/Obstetrics (+) Pregnancy                             Anesthesia Physical  Anesthesia Plan  ASA: II  Anesthesia Plan: Epidural   Post-op Pain Management:    Induction:   PONV Risk Score and Plan: Treatment may vary due to age or medical condition  Airway Management Planned:   Additional Equipment:   Intra-op Plan:   Post-operative Plan:   Informed Consent: I have reviewed the patients History and Physical, chart, labs and discussed the procedure including the risks, benefits and alternatives for the proposed anesthesia with the patient or authorized representative who has indicated his/her understanding and acceptance.     Dental advisory given  Plan Discussed with:   Anesthesia Plan Comments: (Patient identified. Risks/Benefits/Options discussed with patient including but not limited to bleeding, infection, nerve damage, paralysis, failed block, incomplete pain control, headache, blood pressure  changes, nausea, vomiting, reactions to medication both or allergic, itching and postpartum back pain. Confirmed with bedside nurse the patient's most recent platelet count. Confirmed with patient that they are not currently taking any anticoagulation, have any bleeding history or any family history of bleeding disorders. Patient expressed understanding and wished to proceed. All questions were answered. )        Anesthesia Quick Evaluation

## 2019-04-09 NOTE — Progress Notes (Signed)
LABOR PROGRESS NOTE  Erin Tran is a 23 y.o. [phone] at [redacted]w[redacted]d  admitted for PD IOL.  Subjective: Feels well, epidural is working well No pressure  Objective: BP 127/76   Pulse 72   Temp 98 F (36.7 C) (Oral)   Resp 16   Ht 5\' 6"  (1.676 m)   Wt 104.3 kg   LMP 06/26/2018   BMI 37.12 kg/m  or  Vitals:   04/09/19 1807 04/09/19 1812 04/09/19 1832 04/09/19 1902  BP: 123/68 126/64 126/66 127/76  Pulse: 80 74 67 72  Resp: 16 16 16 16   Temp:      TempSrc:      Weight:      Height:        Gen: NAD Pulm: Comfortable room air  Dilation: 5 Effacement (%): 80 Cervical Position: Middle Station: -1 Presentation: Vertex Exam by:: Dr. Janus Molder FHT: baseline rate 125, moderate varibility, +acel, single decel nadir >90 Toco: q2-3  Labs: Lab Results  Component Value Date   WBC 9.4 04/09/2019   HGB 10.1 (L) 04/09/2019   HCT 31.1 (L) 04/09/2019   MCV 91.5 04/09/2019   PLT 261 04/09/2019    Patient Active Problem List   Diagnosis Date Noted  . Indication for care in labor and delivery, antepartum 04/09/2019  . 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 Full-term premature rupture of membranes 06/08/2017  . Late prenatal care 06/03/2017  . Blood type, Rh negative 06/28/2015  . Supervision of high risk pregnancy, antepartum 06/27/2015  . Asthma, intermittent 10/29/2014    Assessment / Plan: 23 y.o. [id number] at [redacted]w[redacted]d here for PD IOL  Labor: s/p miso/AROM, regular contraction pattern, cont regular cervical checks. Occasionally poor contraction pattern, consider pitocin augmentation Fetal Wellbeing:  Single isolated decel but has good variability and accels, reassuring Pain Control:  Epidural working well Anticipated MOD:  NSVD  Clarnce Flock, MD/[facility] OB Fellow  04/09/2019, 7:39 PM

## 2019-04-09 NOTE — Anesthesia Procedure Notes (Signed)
Epidural Patient location during procedure: OB Start time: 04/09/2019 5:36 PM End time: 04/09/2019 5:50 PM  Staffing Anesthesiologist: Lynda Rainwater, MD Performed: anesthesiologist   Preanesthetic Checklist Completed: patient identified, site marked, surgical consent, pre-op evaluation, timeout performed, IV checked, risks and benefits discussed and monitors and equipment checked  Epidural Patient position: sitting Prep: ChloraPrep Patient monitoring: heart rate, cardiac monitor, continuous pulse ox and blood pressure Approach: midline Location: L2-L3 Injection technique: LOR saline  Needle:  Needle type: Tuohy  Needle gauge: 17 G Needle length: 9 cm Needle insertion depth: 7 cm Catheter type: closed end flexible Catheter size: 20 Guage Catheter at skin depth: 12 cm Test dose: negative  Assessment Events: blood not aspirated, injection not painful, no injection resistance, negative IV test and no paresthesia  Additional Notes Reason for block:procedure for pain

## 2019-04-09 NOTE — Progress Notes (Signed)
LABOR PROGRESS NOTE  Erin Tran is a 23 y.o. [phone] at [redacted]w[redacted]d  admitted for [place].  Subjective: She is doing well sitting in the recliner. She is experiencing mild discomfort from contractions.   Objective: BP 116/60 (BP Location: Right Arm)   Pulse 75   Temp 97.8 F (36.6 C) (Oral)   Resp 16   Ht 5\' 6"  (1.676 m)   Wt 104.3 kg   LMP 06/26/2018   BMI 37.12 kg/m  or  Vitals:   04/09/19 0806 04/09/19 1238  BP: 123/70 116/60  Pulse: 85 75  Resp: 16 16  Temp: 98.1 F (36.7 C) 97.8 F (36.6 C)  TempSrc: Oral Oral  Weight: 104.3 kg   Height: 5\' 6"  (1.676 m)      Dilation: 3 Effacement (%): 50 Cervical Position: Posterior Station: -2 Presentation: Vertex Exam by:: Army Fossa RN FHT: baseline rate 135bpm, moderate varibility, 15 x 15 acel, no decel Toco: irritability  Labs: Lab Results  Component Value Date   WBC 9.4 04/09/2019   HGB 10.1 (L) 04/09/2019   HCT 31.1 (L) 04/09/2019   MCV 91.5 04/09/2019   PLT 261 04/09/2019    Patient Active Problem List   Diagnosis Date Noted  . Indication for care in labor and delivery, antepartum 04/09/2019  . 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 Full-term premature rupture of membranes 06/08/2017  . Late prenatal care 06/03/2017  . Blood type, Rh negative 06/28/2015  . Supervision of high risk pregnancy, antepartum 06/27/2015  . Asthma, intermittent 10/29/2014    Assessment / Plan: 23 y.o. [id number] at [redacted]w[redacted]d here for PDIOL.  Labor: Consider another does of cytotec or pitocin with next cervical exam. Continue time appropriate cervical exams.  Fetal Wellbeing:  Cat I Pain Control:  Maternally supported Anticipated MOD: Vaginal  Gerlene Fee, DO [place] PGY-1 04/09/2019, 2:07 PM

## 2019-04-10 ENCOUNTER — Encounter (HOSPITAL_COMMUNITY): Payer: Self-pay

## 2019-04-10 DIAGNOSIS — O360130 Maternal care for anti-D [Rh] antibodies, third trimester, not applicable or unspecified: Secondary | ICD-10-CM

## 2019-04-10 LAB — HIV ANTIBODY (ROUTINE TESTING W REFLEX): HIV Screen 4th Generation wRfx: NONREACTIVE

## 2019-04-10 MED ORDER — BENZOCAINE-MENTHOL 20-0.5 % EX AERO: 1.0000 "application " | INHALATION_SPRAY | CUTANEOUS | Status: DC | PRN

## 2019-04-10 MED ORDER — ACETAMINOPHEN 325 MG PO TABS: 650.0000 mg | ORAL_TABLET | ORAL | Status: DC | PRN

## 2019-04-10 MED ORDER — SIMETHICONE 80 MG PO CHEW: 80.0000 mg | CHEWABLE_TABLET | ORAL | Status: DC | PRN

## 2019-04-10 MED ORDER — ONDANSETRON HCL 4 MG PO TABS: 4.0000 mg | ORAL_TABLET | ORAL | Status: DC | PRN

## 2019-04-10 MED ORDER — PIPERACILLIN-TAZOBACTAM 3.375 G IVPB
3.3750 g | Freq: Three times a day (TID) | INTRAVENOUS | Status: AC
  Administered 2019-04-10 (×3): 3.375 g via INTRAVENOUS
  Filled 2019-04-10 (×3): qty 50

## 2019-04-10 MED ORDER — ONDANSETRON HCL 4 MG/2ML IJ SOLN: 4.0000 mg | INTRAMUSCULAR | Status: DC | PRN

## 2019-04-10 MED ORDER — IBUPROFEN 600 MG PO TABS
600.0000 mg | ORAL_TABLET | Freq: Four times a day (QID) | ORAL | Status: DC
  Administered 2019-04-10 – 2019-04-11 (×5): 600 mg via ORAL
  Filled 2019-04-10 (×6): qty 1

## 2019-04-10 MED ORDER — MEASLES, MUMPS & RUBELLA VAC IJ SOLR: 0.5000 mL | Freq: Once | INTRAMUSCULAR | Status: DC

## 2019-04-10 MED ORDER — DIBUCAINE (PERIANAL) 1 % EX OINT: 1.0000 "application " | TOPICAL_OINTMENT | CUTANEOUS | Status: DC | PRN

## 2019-04-10 MED ORDER — LACTATED RINGERS IV SOLN
Freq: Once | INTRAVENOUS | Status: AC
  Administered 2019-04-10: 02:00:00 via INTRAVENOUS

## 2019-04-10 MED ORDER — PRENATAL MULTIVITAMIN CH
1.0000 | ORAL_TABLET | Freq: Every day | ORAL | Status: DC
  Administered 2019-04-10 – 2019-04-11 (×2): 1 via ORAL
  Filled 2019-04-10 (×2): qty 1

## 2019-04-10 MED ORDER — WITCH HAZEL-GLYCERIN EX PADS: 1.0000 "application " | MEDICATED_PAD | CUTANEOUS | Status: DC | PRN

## 2019-04-10 MED ORDER — TETANUS-DIPHTH-ACELL PERTUSSIS 5-2.5-18.5 LF-MCG/0.5 IM SUSP
0.5000 mL | Freq: Once | INTRAMUSCULAR | Status: AC
  Administered 2019-04-11: 0.5 mL via INTRAMUSCULAR
  Filled 2019-04-10: qty 0.5

## 2019-04-10 MED ORDER — SENNOSIDES-DOCUSATE SODIUM 8.6-50 MG PO TABS
2.0000 | ORAL_TABLET | ORAL | Status: DC
  Administered 2019-04-11: 2 via ORAL
  Filled 2019-04-10: qty 2

## 2019-04-10 MED ORDER — COCONUT OIL OIL: 1.0000 "application " | TOPICAL_OIL | Status: DC | PRN

## 2019-04-10 MED ORDER — TERBUTALINE SULFATE 1 MG/ML IJ SOLN: 0.2500 mg | Freq: Once | INTRAMUSCULAR | Status: DC | PRN

## 2019-04-10 MED ORDER — DIPHENHYDRAMINE HCL 25 MG PO CAPS: 25.0000 mg | ORAL_CAPSULE | Freq: Four times a day (QID) | ORAL | Status: DC | PRN

## 2019-04-10 MED ORDER — OXYTOCIN 40 UNITS IN NORMAL SALINE INFUSION - SIMPLE MED: 1.0000 m[IU]/min | INTRAVENOUS | Status: DC

## 2019-04-10 NOTE — Clinical Social Work Maternal (Signed)
CLINICAL SOCIAL WORK MATERNAL/CHILD NOTE  Patient Details  Name: Erin Tran MRN: [id number] Date of Birth: 01-11-1996  Date:  04/10/2019  Clinical Social Worker Initiating Note:  Elijio Miles Date/Time: Initiated:  04/10/19/1233     Child's Name:  Undecided   Biological Parents:  Mother, Father(Nyjah Greenfield and Hettie Holstein (FOB not involved))   Need for Interpreter:  None   Reason for Referral:  Current Substance Use/Substance Use During Pregnancy , Late or No Prenatal Care    Address:  [place] [address]    Phone number:  [phone] (home)     Additional phone number:   Household Members/Support Persons (HM/SP):   Household Member/Support Person 1, Household Member/Support Person 2, Household Member/Support Person 3, Household Member/Support Person 4   HM/SP Name Relationship DOB or Age  HM/SP -1 Birder Robson MOB's father    HM/SP -2 La'kyren Satira Anis 12/08/2012  HM/SP -3 Ja-mari Satira Anis 06/29/2015  HM/SP -4 Karson Scott Son 06/10/2017  HM/SP -5        HM/SP -6        HM/SP -7        HM/SP -8          Natural Supports (not living in the home):  Parent   Professional Supports: None   Employment: Unemployed   Type of Work:     Education:  Programmer, systems   Homebound arranged:    Museum/gallery curator Resources:      Other Resources:  Physicist, medical (Intends to apply for [facility])   Cultural/Religious Considerations Which May Impact Care:    Strengths:  Ability to meet basic needs , Pediatrician chosen  Psychotropic Medications:         Pediatrician:    [place] area  Pediatrician List:   [facility] ([address])  [place]      Pediatrician Fax Number:    Risk Factors/Current Problems:      Cognitive State:  Alert , Able to Concentrate , Linear Thinking    Mood/Affect:  Bright ,  Calm , Comfortable , Interested , Happy , Relaxed    CSW Assessment:  CSW received consult for [facility] and THC use during pregnancy.  CSW met with MOB to offer support and complete assessment.    MOB sitting up bed holding infant, when CSW entered the room. CSW introduced self and explained reason for consult to which MOB was understanding. MOB welcoming of CSW visit and was engaged throughout assessment. MOB appropriate and attentive to infant during visit. MOB reported she currently lives with her 3 children and her father. MOB stated she is not currently employed but confirmed she receives food stamps and intends to apply for [facility]. CSW inquired about MOB's mental health history and MOB denied having any and denied any PPD with her other children.CSW provided education regarding the baby blues period vs. perinatal mood disorders, discussed treatment and gave resources for mental health follow up if concerns arise.  CSW recommends self-evaluation during the postpartum time period using the New Mom Checklist from Postpartum Progress and encouraged MOB to contact a medical professional if symptoms are noted at any time. MOB denied any current SI, HI or DV and did not appear to be displaying any acute mental health symptoms. MOB reported her primary supports and her mother and father.  CSW inquired about reasoning for why MOB did not receive PNC and MOB explained she did not have Medicaid and was unable to get pregnancy Medicaid. CSW informed MOB of Hospital Drug Policy to which MOB was already aware. CSW explained UDS and CDS were still pending but that a CPS report would be made, if warranted. CSW asked MOB about her substance use during pregnancy and MOB disclosed that she used [facility] for her birthday but stated that was her only use. MOB shared that CPS was involved with her last child after they tested positive for [facility] so MOB is aware of process and denied any questions or concerns.  MOB reported she had  waited to find out the sex of infant until she delivered so she does not have everything she needs at this time but reported she will by discharge. Per MOB, her father is getting a car seat today and confirmed they will have a safe place for infant to sleep once discharged. CSW provided review of Sudden Infant Death Syndrome (SIDS) precautions and safe sleeping habits. CSW offered to make referrals for additional support once discharged but MOB declined at this time.   CSW will continue to monitor UDS and CDS and make report if necessary.   CSW Plan/Description:  No Further Intervention Required/No Barriers to Discharge, Sudden Infant Death Syndrome (SIDS) Education, Perinatal Mood and Anxiety Disorder (PMADs) Education, [place], CSW Will Continue to Monitor Umbilical Cord Tissue Drug Screen Results and Make Report if Foye Spurling, [place] 04/10/2019, 1:30 PM

## 2019-04-10 NOTE — Anesthesia Postprocedure Evaluation (Signed)
Anesthesia Post Note  Patient: SHERRIL SHIPMAN  Procedure(s) Performed: AN AD HOC LABOR EPIDURAL     Patient location during evaluation: Mother Baby Anesthesia Type: Epidural Level of consciousness: awake, awake and alert and oriented Pain management: pain level controlled Vital Signs Assessment: post-procedure vital signs reviewed and stable Respiratory status: spontaneous breathing, nonlabored ventilation and respiratory function stable Cardiovascular status: stable Postop Assessment: no headache, no backache, patient able to bend at knees, no apparent nausea or vomiting, adequate PO intake and able to ambulate Anesthetic complications: no    Last Vitals:  Vitals:   04/10/19 0924 04/10/19 1359  BP: 111/68 123/67  Pulse: 63 63  Resp: 20 19  Temp: 37.1 C 36.6 C  SpO2: 98% 100%    Last Pain:  Vitals:   04/10/19 1812  TempSrc:   PainSc: 2    Pain Goal:                   Elsie Baynes

## 2019-04-10 NOTE — Progress Notes (Signed)
POSTPARTUM PROGRESS NOTE  Post Partum Day 1  Subjective:  Erin Tran is a 23 y.o. [id number] s/p induced vaginal delivery at [redacted]w[redacted]d.  She reports she is doing well and is watching TV in bed this AM. No acute events overnight. She denies any problems with ambulating, voiding or po intake. Denies nausea or vomiting.  Pain is moderately controlled; she is experiencing some lower pelvic pain following delivery.  Lochia is appropriate.  Objective: Blood pressure 123/60, pulse 64, temperature 99.2 F (37.3 C), temperature source Oral, resp. rate 16, height 5\' 6"  (1.676 m), weight 104.3 kg, last menstrual period 06/26/2018, SpO2 100 %, unknown if currently breastfeeding.  Physical Exam:  General: alert, cooperative and no distress Chest: no respiratory distress. CTAB Heart:regular rate, distal pulses intact Abdomen: soft, nontender,  Uterine Fundus: firm, appropriately tender DVT Evaluation: No calf swelling or tenderness Extremities: No edema Skin: warm, dry  Recent Labs    04/09/19 0812  HGB 10.1*  HCT 31.1*    Assessment/Plan: Erin Tran is a 23 y.o. [id number] s/p induced vaginal delivery at [redacted]w[redacted]d.  PPD#1 - Doing well     Routine postpartum care Contraception: undecided Feeding: breast Dispo: Plan for discharge 8/16.   LOS: 1 day   [place] Student 04/10/2019, 8:42 AM

## 2019-04-10 NOTE — Progress Notes (Signed)
Line not unclamped for earlier acheduled administration of Zosyn at 1658/ pharmacy ok'd restart of medication. Restarted at 2205

## 2019-04-10 NOTE — Discharge Summary (Signed)
Postpartum Discharge Summary     Patient Name: Erin Tran DOB: [birth year] MRN: [id number]  Date of admission: 04/09/2019 Delivering Provider: Chauncey Mann   Date of discharge: 04/11/2019  Admitting diagnosis: pregnancy Intrauterine pregnancy: [redacted]w[redacted]d     Secondary diagnosis:  Active Problems:   Indication for care in labor and delivery, antepartum  Additional problems: Rh neg-Ab pos, no prenatal care      Discharge diagnosis: Term Pregnancy Delivered                                                                                                Post partum procedures:none  Augmentation: AROM, Pitocin and Cytotec  Complications: Partial cord avulsion with manual extration  Hospital course:  Induction of Labor With Vaginal Delivery   23 y.o. yo [phone] at [redacted]w[redacted]d was admitted to the hospital 04/09/2019 for induction of labor.  Indication for induction: Postdates.  Patient had an uncomplicated labor course as follows. Patient presented to L&D for IOL secondary to post-dates. Initial SVE: 2.5/thick/-3. Cytotec placed. AROM performed, epidural placed and Pitocin started. She then progressed to complete. Placenta did not deliver spontaneously with gentle cord traction and was ultimately removed manually after partial cord avulsion. Zosyn initiated for 24 hours post-partum due to manual extraction of placenta. Rhogam given post-partum . Membrane Rupture Time/Date: 6:27 PM ,04/09/2019   Intrapartum Procedures: Episiotomy: None [1]                                         Lacerations:  1st degree [2]  Patient had delivery of a Viable infant.  Information for the patient's newborn:  Cerena, Baine [[id number]]  Delivery Method: Vaginal, Spontaneous(Filed from Delivery Summary)    04/09/2019  Details of delivery can be found in separate delivery note.  Patient had a routine postpartum course. Patient is discharged home 04/11/19.  Magnesium Sulfate recieved: No BMZ received:  No  Physical exam  Vitals:   04/10/19 0924 04/10/19 1359 04/10/19 2300 04/11/19 0514  BP: 111/68 123/67 134/77 119/71  Pulse: 63 63 68 (!) 52  Resp: 20 19 16 16   Temp: 98.8 F (37.1 C) 97.8 F (36.6 C) 98.5 F (36.9 C) (!) 97.5 F (36.4 C)  TempSrc: Oral Oral Oral Oral  SpO2: 98% 100% 100% 100%  Weight:      Height:       General: alert, cooperative and no distress Lochia: appropriate Uterine Fundus: firm Incision: N/A DVT Evaluation: No evidence of DVT seen on physical exam. Labs: Lab Results  Component Value Date   WBC 9.4 04/09/2019   HGB 10.1 (L) 04/09/2019   HCT 31.1 (L) 04/09/2019   MCV 91.5 04/09/2019   PLT 261 04/09/2019   CMP Latest Ref Rng & Units 03/06/2015  Glucose 65 - 99 mg/dL 89  BUN 6 - 20 mg/dL 7  Creatinine 0.44 - 1.00 mg/dL 0.59  Sodium 135 - 145 mmol/L 137  Potassium 3.5 - 5.1 mmol/L 3.9  Chloride 101 - 111  mmol/L 106  CO2 22 - 32 mmol/L 25  Calcium 8.9 - 10.3 mg/dL 7.8(G8.7(L)  Total Protein 6.5 - 8.1 g/dL 6.6  Total Bilirubin 0.3 - 1.2 mg/dL 0.4  Alkaline Phos 38 - 126 U/L 77  AST 15 - 41 U/L 12(L)  ALT 14 - 54 U/L 10(L)    Discharge instruction: per After Visit Summary and "Baby and Me Booklet".  After visit meds:  Allergies as of 04/11/2019   No Known Allergies     Medication List    TAKE these medications   albuterol 108 (90 Base) MCG/ACT inhaler Commonly known as: VENTOLIN HFA Inhale 2 puffs into the lungs every 6 (six) hours as needed for wheezing or shortness of breath.   cetirizine 10 MG tablet Commonly known as: ZyrTEC Allergy Take 1 tablet (10 mg total) by mouth daily.   ibuprofen 600 MG tablet Commonly known as: ADVIL Take 1 tablet (600 mg total) by mouth every 6 (six) hours.   metroNIDAZOLE 500 MG tablet Commonly known as: Flagyl Take 1 tablet (500 mg total) by mouth 2 (two) times daily.   prenatal multivitamin Tabs tablet Take 1 tablet by mouth daily at 12 noon.       Diet: routine diet  Activity: Advance as  tolerated. Pelvic rest for 6 weeks.   Outpatient follow up:4 weeks Follow up Appt:No future appointments. Follow up Visit: Follow-up Information    [facility]. Schedule an appointment as soon as possible for a visit in 4 week(s).   Specialty: Obstetrics and Gynecology Contact information: [address], [address] [id number] [place] [phone]           Please schedule this patient for Postpartum visit in: 4 weeks with the following provider: Any provider Undertimined risk pregnancy complicated by: No prenatal care, O neg/Ab pos, Manual extraction of placenta Delivery mode:  SVD Anticipated Birth Control:  other/unsure PP Procedures needed: possible birth control method  Schedule Integrated BH visit: no      Newborn Data: Live born female  Birth Weight:   APGAR: 9, 10  Newborn Delivery   Birth date/time: 04/09/2019 23:53:00 Delivery type: Vaginal, Spontaneous      Baby Feeding: Breast Disposition:home with mother   04/11/2019 Wynelle BourgeoisMarie Taryll Reichenberger, CNM

## 2019-04-10 NOTE — Lactation Note (Signed)
This note was copied from a baby's chart. Lactation Consultation Note Baby 5 hrs old. Mom states BF well. Mom is BF/formula feeding. Encouraged to BF first before giving formula. Mom has 69,4, and 25 yr old. Mom states she BF 4-5 months each. Mom denies painful latches. Mom demonstrated hand expression w/colostrum easily expressed from her flat compressible nipples. Mom encouraged to feed baby 8-12 times/24 hours and with feeding cues. Mom encouraged to waken baby for feeds if baby hasn't cued in 3 hrs. Newborn behavior, STS, I&O, breast massage, milk storage, supply and demand discussed. Mom stated she was doing well w/no questions or concerns at this time. Encouraged mom to call for assistance or questions. Lactation brochure given.  Patient Name: Erin Tran HENID'P Date: 04/10/2019 Reason for consult: Initial assessment;Term   Maternal Data Has patient been taught Hand Expression?: Yes Does the patient have breastfeeding experience prior to this delivery?: Yes  Feeding Feeding Type: Breast Fed  LATCH Score Latch: Grasps breast easily, tongue down, lips flanged, rhythmical sucking.  Audible Swallowing: A few with stimulation  Type of Nipple: Flat  Comfort (Breast/Nipple): Soft / non-tender  Hold (Positioning): No assistance needed to correctly position infant at breast.  LATCH Score: 9  Interventions Interventions: Breast feeding basics reviewed;Hand express;Breast compression  Lactation Tools Discussed/Used WIC Program: Yes   Consult Status Consult Status: Follow-up Date: 04/11/19 Follow-up type: In-patient    Theodoro Kalata 04/10/2019, 5:17 AM

## 2019-04-11 LAB — RPR: RPR Ser Ql: NONREACTIVE

## 2019-04-11 MED ORDER — IBUPROFEN 600 MG PO TABS: 600.0000 mg | ORAL_TABLET | Freq: Four times a day (QID) | ORAL | 0 refills | Status: DC

## 2019-04-11 NOTE — Lactation Note (Signed)
This note was copied from a baby's chart. Lactation Consultation Note  Patient Name: Boy Jack Mineau [id number] Date: 04/11/2019 Reason for consult: Follow-up assessment;Infant weight loss;Term Randel Books is 22 hours old  Confirmed with mom she is breast feeding and bottle feeding still .  LC reviewed supply and demand and stressed the importance of giving the baby  Lots of practice at the breast , supplmneent only after he feds the 1st breast and is offered the 2nd breast .  Discussed importance of feeding STS until the baby is back to birth weight , gaining steadily and can stay awake for majority of feeding.  Mom denies sore nipples. Sore nipples and engorgement prevention and tx reviewed. [place] instructed mom on the use of the hand pump and provided a #27 F due the #24 f being ok for today, but when milk comes in probably will need the larger one.  Baby awake and LC offered to assess the baby at the breast and mom willing to latch on the right breast football. Worked on obtaining depth and baby was still feeding at 10 mins with swallows , increased with compressions. LC recommended after the 1st breast if still hungry offer the 2nd breast prior to supplementing, if the baby is satisfied hold the formula.  Mom aware of the [facility] resources after D/C .   Maternal Data Has patient been taught Hand Expression?: Yes  Feeding Feeding Type: Bottle Fed - Formula Nipple Type: Slow - flow  LATCH Score                   Interventions Interventions: Breast feeding basics reviewed  Lactation Tools Discussed/Used Tools: Pump;Flanges Flange Size: 24;27 Breast pump type: Manual WIC Program: Yes Pump Review: Milk Storage;Setup, frequency, and cleaning Initiated by:: MAI   Consult Status Consult Status: Complete Date: 04/11/19    Myer Haff 04/11/2019, 9:17 AM

## 2019-04-11 NOTE — Discharge Instructions (Signed)

## 2019-04-12 LAB — TYPE AND SCREEN
ABO/RH(D): O NEG
Antibody Screen: POSITIVE
Donor AG Type: NEGATIVE
Donor AG Type: NEGATIVE
Unit division: 0
Unit division: 0

## 2019-04-12 LAB — BPAM RBC
Blood Product Expiration Date: 202008252359
Blood Product Expiration Date: 202008262359
Unit Type and Rh: 9500
Unit Type and Rh: 9500

## 2019-05-12 ENCOUNTER — Telehealth: Payer: Self-pay | Admitting: Obstetrics & Gynecology

## 2019-05-12 NOTE — Telephone Encounter (Signed)
Called the patient to inform of the upcoming appointment. Informed the patient of wearing a mask, no visitors, or children are allowed due to covid restrictions. The patient also answered no to all of the covid19 screening questions. °

## 2019-05-13 ENCOUNTER — Ambulatory Visit: Payer: Self-pay | Admitting: Obstetrics and Gynecology

## 2019-05-18 ENCOUNTER — Ambulatory Visit (HOSPITAL_COMMUNITY)
Admission: EM | Admit: 2019-05-18 | Discharge: 2019-05-18 | Disposition: A | Discharge status: Home / Self Care | Payer: Medicaid Other | Attending: Family Medicine | Admitting: Family Medicine

## 2019-05-18 ENCOUNTER — Other Ambulatory Visit: Payer: Self-pay

## 2019-05-18 ENCOUNTER — Encounter (HOSPITAL_COMMUNITY): Payer: Self-pay

## 2019-05-18 DIAGNOSIS — R21 Rash and other nonspecific skin eruption: Secondary | ICD-10-CM

## 2019-05-18 DIAGNOSIS — R030 Elevated blood-pressure reading, without diagnosis of hypertension: Secondary | ICD-10-CM

## 2019-05-18 MED ORDER — CEPHALEXIN 500 MG PO CAPS: 500.0000 mg | ORAL_CAPSULE | Freq: Three times a day (TID) | ORAL | 0 refills | Status: DC

## 2019-05-18 MED ORDER — PREDNISONE 10 MG (21) PO TBPK: ORAL_TABLET | Freq: Every day | ORAL | 0 refills | Status: DC

## 2019-05-18 NOTE — Discharge Instructions (Signed)
Your blood pressure was noted to be elevated during your visit today. You may return here within the next few days to recheck if unable to see your primary care doctor. If your blood pressure remains persistently elevated, you may need to begin taking a medication.  BP (!) 154/128 (BP Location: Left Arm)    Pulse 75    Temp 98 F (36.7 C) (Oral)    Resp 18    LMP 06/26/2018    SpO2 100%

## 2019-05-18 NOTE — ED Triage Notes (Signed)
Pt presents with rash on sides, back and around ear; pt states son and godson had same rash a week ago.

## 2019-05-18 NOTE — ED Provider Notes (Signed)
Erin Tran   [id number] 05/18/19 Arrival Time: 2542  ASSESSMENT & PLAN:  1. Rash and nonspecific skin eruption   2. Elevated blood pressure reading without diagnosis of hypertension     With impetigo in household recently will cover with antibiotic in addition to prednisone.  Meds ordered this encounter  Medications  . cephALEXin (KEFLEX) 500 MG capsule    Sig: Take 1 capsule (500 mg total) by mouth 3 (three) times daily.    Dispense:  30 capsule    Refill:  0  . predniSONE (STERAPRED UNI-PAK 21 TAB) 10 MG (21) TBPK tablet    Sig: Take by mouth daily. Take as directed.    Dispense:  21 tablet    Refill:  0    Follow-up Information    [facility].   Specialty: Urgent Care Why: As needed. Contact information: [place] [phone]         Rec est care with PCP to follow BP. May recheck here if desired. Reviewed expectations re: course of current medical issues. Questions answered. Outlined signs and symptoms indicating need for more acute intervention. Patient verbalized understanding. After Visit Summary given.   SUBJECTIVE:  Erin Tran is a 23 y.o. female who presents with a skin rash. Son dx and treated for impetigo last week; questions if she has caught this. Noticed this rash approx one week ago. No itching. No drainage. Has noted on sides of abdomen and around her left ear. Afebrile. No OTC tx. No h/o similar. No associated pain. No specific aggravating or alleviating factors reported.  Increased blood pressure noted today. Reports that she has not been treated for hypertension in the past.  She reports no chest pain on exertion, no dyspnea on exertion, no swelling of ankles, no orthostatic dizziness or lightheadedness, no orthopnea or paroxysmal nocturnal dyspnea, no palpitations and no intermittent claudication symptoms.  ROS: As per HPI. All other systems  negative.   OBJECTIVE: Vitals:   05/18/19 1735  BP: (!) 154/128  Pulse: 75  Resp: 18  Temp: 98 F (36.7 C)  TempSrc: Oral  SpO2: 100%    General appearance: alert; no distress HEENT: [place]; AT Neck: supple without JVD Lungs: clear to auscultation bilaterally Heart: regular rate and rhythm Extremities: no edema Skin: warm and dry; signs of infection: no; non-specific patches of erythema over sides of abdomen and over posterior neck; no drainage or bleeding Neuro: normal gait Psychological: alert and cooperative; normal mood and affect  No Known Allergies  Past Medical History:  Diagnosis Date  . Asthma    Social History   Socioeconomic History  . Marital status: Single    Spouse name: Not on file  . Number of children: Not on file  . Years of education: Not on file  . Highest education level: Not on file  Occupational History    Employer: [employer]    Comment: Quit when she found out she was pregnant  Social Needs  . Financial resource strain: Not hard at all  . Food insecurity    Worry: Never true    Inability: Never true  . Transportation needs    Medical: No    Non-medical: No  Tobacco Use  . Smoking status: Former Smoker    Packs/day: 0.25    Types: Cigarettes  . Smokeless tobacco: Never Used  Substance and Sexual Activity  . Alcohol use: No  . Drug use: No  .  Sexual activity: Yes    Birth control/protection: None  Lifestyle  . Physical activity    Days per week: 0 days    Minutes per session: 0 min  . Stress: Not at all  Relationships  . Social Musician on phone: Not on file    Gets together: Not on file    Attends religious service: Not on file    Active member of club or organization: Not on file    Attends meetings of clubs or organizations: Not on file    Relationship status: Not on file  . Intimate partner violence    Fear of current or ex partner: Not on file    Emotionally abused: Not on file    Physically abused: Not on file     Forced sexual activity: Not on file  Other Topics Concern  . Not on file  Social History Narrative   ** Merged History Encounter **       Ms. Biever lives with her mother in [place]. FOB of 2nd pregnancy is the same FOB for her 77-year old son and he lives in [place]. They are not on good terms and she is unsure whether or not he will be involved in her son's life. She denies smoke ex   posure, alcohol, or other drug use. She previously worked at [employer] but quit when she found out she was pregnant.   Family History  Problem Relation Age of Onset  . Diabetes Maternal Grandmother   . Cancer Neg Hx   . Hypertension Neg Hx   . Hyperlipidemia Neg Hx   . Seizures Mother   . Stroke Neg Hx    Past Surgical History:  Procedure Laterality Date  . WISDOM TOOTH EXTRACTION       Mardella Layman, MD 05/24/19 [phone]

## 2021-06-16 ENCOUNTER — Other Ambulatory Visit: Payer: Self-pay

## 2021-06-16 ENCOUNTER — Emergency Department (HOSPITAL_COMMUNITY)
Admission: EM | Admit: 2021-06-16 | Discharge: 2021-06-16 | Disposition: A | Discharge status: Home / Self Care | Payer: Medicaid Other | Attending: Emergency Medicine | Admitting: Emergency Medicine

## 2021-06-16 DIAGNOSIS — M2559 Pain in other specified joint: Secondary | ICD-10-CM | POA: Diagnosis present

## 2021-06-16 DIAGNOSIS — J45909 Unspecified asthma, uncomplicated: Secondary | ICD-10-CM | POA: Diagnosis not present

## 2021-06-16 DIAGNOSIS — Z87891 Personal history of nicotine dependence: Secondary | ICD-10-CM | POA: Diagnosis not present

## 2021-06-16 DIAGNOSIS — Z20822 Contact with and (suspected) exposure to covid-19: Secondary | ICD-10-CM | POA: Diagnosis not present

## 2021-06-16 DIAGNOSIS — M791 Myalgia, unspecified site: Secondary | ICD-10-CM | POA: Diagnosis not present

## 2021-06-16 LAB — CBC
HCT: 38.3 % (ref 36.0–46.0)
Hemoglobin: 12.8 g/dL (ref 12.0–15.0)
MCH: 31.3 pg (ref 26.0–34.0)
MCHC: 33.4 g/dL (ref 30.0–36.0)
MCV: 93.6 fL (ref 80.0–100.0)
Platelets: 403 10*3/uL — ABNORMAL HIGH (ref 150–400)
RBC: 4.09 MIL/uL (ref 3.87–5.11)
RDW: 13.1 % (ref 11.5–15.5)
WBC: 9.7 10*3/uL (ref 4.0–10.5)
nRBC: 0 % (ref 0.0–0.2)

## 2021-06-16 LAB — BASIC METABOLIC PANEL
Anion gap: 9 (ref 5–15)
BUN: 8 mg/dL (ref 6–20)
CO2: 29 mmol/L (ref 22–32)
Calcium: 9.2 mg/dL (ref 8.9–10.3)
Chloride: 98 mmol/L (ref 98–111)
Creatinine, Ser: 0.78 mg/dL (ref 0.44–1.00)
GFR, Estimated: >60 mL/min (ref >60)
Glucose, Bld: 96 mg/dL (ref 70–99)
Potassium: 3 mmol/L — ABNORMAL LOW (ref 3.5–5.1)
Sodium: 136 mmol/L (ref 135–145)

## 2021-06-16 LAB — CK: Total CK: 58 U/L (ref 38–234)

## 2021-06-16 LAB — RESP PANEL BY RT-PCR (FLU A&B, COVID) ARPGX2
Influenza A by PCR: NEGATIVE
Influenza B by PCR: NEGATIVE
SARS Coronavirus 2 by RT PCR: NEGATIVE

## 2021-06-16 MED ORDER — HYDROCODONE-ACETAMINOPHEN 5-325 MG PO TABS: 1.0000 | ORAL_TABLET | ORAL | 0 refills | Status: DC | PRN

## 2021-06-16 MED ORDER — DEXAMETHASONE SODIUM PHOSPHATE 10 MG/ML IJ SOLN
10.0000 mg | Freq: Once | INTRAMUSCULAR | Status: AC
  Administered 2021-06-16: 10 mg via INTRAMUSCULAR
  Filled 2021-06-16: qty 1

## 2021-06-16 MED ORDER — PREDNISONE 10 MG (21) PO TBPK
ORAL_TABLET | Freq: Every day | ORAL | 0 refills | Status: DC
Start: 2021-06-16 — End: 2021-06-16

## 2021-06-16 MED ORDER — IBUPROFEN 600 MG PO TABS
600.0000 mg | ORAL_TABLET | Freq: Four times a day (QID) | ORAL | 0 refills | Status: DC | PRN
Start: 2021-06-16 — End: 2024-03-10

## 2021-06-16 MED ORDER — HYDROCODONE-ACETAMINOPHEN 5-325 MG PO TABS
1.0000 | ORAL_TABLET | Freq: Once | ORAL | Status: AC
  Administered 2021-06-16: 1 via ORAL
  Filled 2021-06-16: qty 1

## 2021-06-16 MED ORDER — PREDNISONE 10 MG (21) PO TBPK: ORAL_TABLET | Freq: Every day | ORAL | 0 refills | Status: DC

## 2021-06-16 MED ORDER — KETOROLAC TROMETHAMINE 30 MG/ML IJ SOLN
30.0000 mg | Freq: Once | INTRAMUSCULAR | Status: AC
  Administered 2021-06-16: 30 mg via INTRAMUSCULAR
  Filled 2021-06-16: qty 1

## 2021-06-16 MED ORDER — IBUPROFEN 600 MG PO TABS: 600.0000 mg | ORAL_TABLET | Freq: Four times a day (QID) | ORAL | 0 refills | Status: DC | PRN

## 2021-06-16 NOTE — ED Triage Notes (Addendum)
Pt here d/t joint/muscle pain in multiple areas. Pt states she cant get into the shower by herself X1 week. Feels as if she "locks up" denies trauma.

## 2021-06-16 NOTE — ED Provider Notes (Signed)
Emergency Medicine Provider Triage Evaluation Note  Erin Tran , a 25 y.o. female  was evaluated in triage.  Pt complains of pelvic girdle, bilateral upper legs, back, and neck pain for the past week.  Patient reports that his artery to get dressed and get in and out of the shower denies any recent illnesses.  Denies any traumas, falls, MVC's.  Denies any heavy activity recently.  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  Review of Systems  Positive: Myalgia, arthralgia Negative: Fevers, IV drug use, recent URI illness  Physical Exam  BP (!) 143/90 (BP Location: Right Arm)   Pulse (!) 102   Temp 99.5 F (37.5 C) (Oral)   Resp 18   SpO2 100%  Gen:   Awake, no distress    Resp:  Normal effort  MSK:   Moves extremities without difficulty  Other:  No joint swelling appreciated  Medical Decision Making  Medically screening exam initiated at 2:20 PM.  Appropriate orders placed.  Erin Tran was informed that the remainder of the evaluation will be completed by another provider, this initial triage assessment does not replace that evaluation, and the importance of remaining in the ED until their evaluation is complete.     Achille Rich, PA-C 06/16/21 1422    Jacalyn Lefevre, MD 06/16/21 1539

## 2021-06-16 NOTE — ED Notes (Signed)
Resources for follow up with [facility] provided d/t pt having no PCP. Discharge instructions and prescriptions reviewed pt verbalized understanding.

## 2021-06-16 NOTE — ED Provider Notes (Signed)
[facility] EMERGENCY DEPARTMENT Provider Note   CSN: [id number] Arrival date & time: 06/16/21  1359     History Chief Complaint  Patient presents with   Joint Pain    Erin Tran is a 25 y.o. female.  Pt presents to the ED today with joint pain.  Pain is worse in her low back and hips.  Pt said she had no trauma.  She said sx started suddenly a few days ago upon awakening.  She has taken ibuprofen without improvement in pain.  She has numbness in her left 4th and 5th finger.  No numbness in her legs.  She is able to walk, but it hurts to walk.  No bowel or bladder incontinence.      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 08/11/2018   Full-term premature rupture of membranes 06/08/2017   Late prenatal care 06/03/2017   Blood type, Rh negative 06/28/2015   Supervision of high risk pregnancy, antepartum 06/27/2015   Asthma, intermittent 10/29/2014    Past Surgical History:  Procedure Laterality Date   WISDOM TOOTH EXTRACTION       OB History     Gravida  5   Para  4   Term  3   Preterm  1   AB  1   Living  4      SAB  0   IAB  0   Ectopic  0   Multiple  0   Live Births  4           Family History  Problem Relation Age of Onset   Diabetes Maternal Grandmother    Cancer Neg Hx    Hypertension Neg Hx    Hyperlipidemia Neg Hx    Seizures Mother    Stroke Neg Hx     Social History   Tobacco Use   Smoking status: Former    Packs/day: 0.25    Types: Cigarettes   Smokeless tobacco: Never  Vaping Use   Vaping Use: Never used  Substance Use Topics   Alcohol use: No   Drug use: No    Home Medications Prior to Admission medications   Medication Sig Start Date End Date Taking? Authorizing Provider   HYDROcodone-acetaminophen (NORCO/VICODIN) 5-325 MG tablet Take 1 tablet by mouth every 4 (four) hours as needed. 06/16/21  Yes Jacalyn Lefevre, MD  ibuprofen (ADVIL) 600 MG tablet Take 1 tablet (600 mg total) by mouth every 6 (six) hours as needed. 06/16/21  Yes Jacalyn Lefevre, MD  predniSONE (STERAPRED UNI-PAK 21 TAB) 10 MG (21) TBPK tablet Take by mouth daily. Take 6 tabs by mouth daily  for 2 days, then 5 tabs for 2 days, then 4 tabs for 2 days, then 3 tabs for 2 days, 2 tabs for 2 days, then 1 tab by mouth daily for 2 days 06/16/21  Yes Jacalyn Lefevre, MD  albuterol (PROVENTIL HFA;VENTOLIN HFA) 108 (90 Base) MCG/ACT inhaler Inhale 2 puffs into the lungs every 6 (six) hours as needed for wheezing or shortness of breath. 06/03/17   Reva Bores, MD  cephALEXin (KEFLEX) 500 MG capsule Take 1 capsule (500 mg total) by mouth 3 (three) times daily. 05/18/19   Mardella Layman, MD  cetirizine (ZYRTEC ALLERGY) 10 MG tablet  Take 1 tablet (10 mg total) by mouth daily. 06/24/17   Antony Madura, PA-C  Prenatal Vit-Fe Fumarate-FA (PRENATAL MULTIVITAMIN) TABS tablet Take 1 tablet by mouth daily at 12 noon. 08/10/18   Calvert Cantor, CNM    Allergies    Patient has no known allergies.  Review of Systems   Review of Systems  Musculoskeletal:  Positive for arthralgias and myalgias.  All other systems reviewed and are negative.  Physical Exam Updated Vital Signs BP (!) 143/90 (BP Location: Right Arm)   Pulse (!) 102   Temp 99.5 F (37.5 C) (Oral)   Resp 18   SpO2 100%   Physical Exam Vitals and nursing note reviewed.  Constitutional:      Appearance: Normal appearance. She is obese.  HENT:     Head: Normocephalic and atraumatic.     Right Ear: External ear normal.     Left Ear: External ear normal.     Nose: Nose normal.     Mouth/Throat:     Mouth: Mucous membranes are moist.     Pharynx: Oropharynx is clear.  Eyes:     Extraocular Movements: Extraocular movements intact.     Pupils:  Pupils are equal, round, and reactive to light.  Cardiovascular:     Rate and Rhythm: Normal rate and regular rhythm.     Pulses: Normal pulses.     Heart sounds: Normal heart sounds.  Pulmonary:     Effort: Pulmonary effort is normal.     Breath sounds: Normal breath sounds.  Abdominal:     General: Abdomen is flat. Bowel sounds are normal.     Palpations: Abdomen is soft.  Musculoskeletal:        General: Normal range of motion.     Cervical back: Normal range of motion and neck supple.  Skin:    General: Skin is warm.     Capillary Refill: Capillary refill takes less than 2 seconds.  Neurological:     General: No focal deficit present.     Mental Status: She is alert and oriented to person, place, and time.  Psychiatric:        Mood and Affect: Mood normal.        Behavior: Behavior normal.    ED Results / Procedures / Treatments   Labs (all labs ordered are listed, but only abnormal results are displayed) Labs Reviewed  CBC - Abnormal; Notable for the following components:      Result Value   Platelets 403 (*)    All other components within normal limits  BASIC METABOLIC PANEL - Abnormal; Notable for the following components:   Potassium 3.0 (*)    All other components within normal limits  RESP PANEL BY RT-PCR (FLU A&B, COVID) ARPGX2  CK  I-STAT BETA HCG BLOOD, ED ([facility], [facility], [facility] ONLY)    EKG None  Radiology No results found.  Procedures Procedures   Medications Ordered in ED Medications  HYDROcodone-acetaminophen (NORCO/VICODIN) 5-325 MG per tablet 1 tablet (has no administration in time range)  dexamethasone (DECADRON) injection 10 mg (10 mg Intramuscular Given 06/16/21 1608)  ketorolac (TORADOL) 30 MG/ML injection 30 mg (30 mg Intramuscular Given 06/16/21 1608)    ED Course  I have reviewed the triage vital signs and the nursing notes.  Pertinent labs & imaging results that were available during my care of the patient were reviewed by me and considered  in my medical decision making (see chart for details).    MDM  Rules/Calculators/A&P                           Pain is improved.  Covid/flu neg. She has been ambulatory.  Pt is stable for d/c.  Return if worse.  F/u with pcp.  Final Clinical Impression(s) / ED Diagnoses Final diagnoses:  Myalgia    Rx / DC Orders ED Discharge Orders          Ordered    HYDROcodone-acetaminophen (NORCO/VICODIN) 5-325 MG tablet  Every 4 hours PRN        06/16/21 1735    ibuprofen (ADVIL) 600 MG tablet  Every 6 hours PRN        06/16/21 1735    predniSONE (STERAPRED UNI-PAK 21 TAB) 10 MG (21) TBPK tablet  Daily        06/16/21 1735             Jacalyn Lefevre, MD 06/16/21 1738

## 2021-06-17 LAB — I-STAT BETA HCG BLOOD, ED (MC, WL, AP ONLY): I-stat hCG, quantitative: <5 m[IU]/mL (ref <5)

## 2021-06-17 LAB — I-STAT CHEM 8, ED
BUN: 8 mg/dL (ref 6–20)
Calcium, Ion: 1.07 mmol/L — ABNORMAL LOW (ref 1.15–1.40)
Chloride: 98 mmol/L (ref 98–111)
Creatinine, Ser: 0.7 mg/dL (ref 0.44–1.00)
Glucose, Bld: 99 mg/dL (ref 70–99)
HCT: 40 % (ref 36.0–46.0)
Hemoglobin: 13.6 g/dL (ref 12.0–15.0)
Potassium: 3.3 mmol/L — ABNORMAL LOW (ref 3.5–5.1)
Sodium: 138 mmol/L (ref 135–145)
TCO2: 28 mmol/L (ref 22–32)

## 2021-11-16 ENCOUNTER — Emergency Department (HOSPITAL_COMMUNITY)
Admission: EM | Admit: 2021-11-16 | Discharge: 2021-11-17 | Disposition: A | Discharge status: Home / Self Care | Payer: Medicaid Other | Attending: Emergency Medicine | Admitting: Emergency Medicine

## 2021-11-16 ENCOUNTER — Encounter (HOSPITAL_COMMUNITY): Payer: Self-pay | Admitting: Emergency Medicine

## 2021-11-16 ENCOUNTER — Other Ambulatory Visit: Payer: Self-pay

## 2021-11-16 DIAGNOSIS — L02415 Cutaneous abscess of right lower limb: Secondary | ICD-10-CM | POA: Diagnosis present

## 2021-11-16 DIAGNOSIS — J45909 Unspecified asthma, uncomplicated: Secondary | ICD-10-CM | POA: Insufficient documentation

## 2021-11-16 DIAGNOSIS — L0291 Cutaneous abscess, unspecified: Secondary | ICD-10-CM

## 2021-11-16 NOTE — ED Triage Notes (Signed)
Pt states she thinks she has a spider bite. Right thigh swollen and painful to touch after she squeezed an abscess there. ?

## 2021-11-16 NOTE — ED Provider Triage Note (Signed)
Emergency Medicine Provider Triage Evaluation Note ? ?Erin Tran , a 26 y.o. female  was evaluated in triage.  Pt complains of wound to anterior right thigh.  That may she was bitten by a spider however did not see a spider.  Painful to touch.  Tried squeezing on it without any relief.  Some surrounding redness.  No history of abscess, DM. No recent trauma ? ?Review of Systems  ?Positive: Possible abscess ?Negative: Fever, emesis ? ?Physical Exam  ?BP (!) 142/70 (BP Location: Right Arm)   Pulse 81   Temp 98.2 ?F (36.8 ?C) (Oral)   Resp 20   Ht 5\' 6"  (1.676 m)   Wt 104.3 kg   SpO2 100%   BMI 37.11 kg/m?  ?Gen:   Awake, no distress   ?Resp:  Normal effort  ?MSK:   Moves extremities without difficulty, 7cm x 6 cm area to right anterior thigh with induration with erythema. No obvious large fluctuance.  ?Other:   ? ?Medical Decision Making  ?Medically screening exam initiated at 11:13 PM.  Appropriate orders placed.  Halen EDDYE WIESENFELD was informed that the remainder of the evaluation will be completed by another provider, this initial triage assessment does not replace that evaluation, and the importance of remaining in the ED until their evaluation is complete. ? ?Possible abscess ?  ?Lateasha Breuer A, PA-C ?11/16/21 2315 ? ?

## 2021-11-17 MED ORDER — DOXYCYCLINE HYCLATE 100 MG PO CAPS: 100.0000 mg | ORAL_CAPSULE | Freq: Two times a day (BID) | ORAL | 0 refills | Status: AC

## 2021-11-17 NOTE — Discharge Instructions (Addendum)
You have very small collection of pus which was drained today.  These types of abscesses generally formed under areas of shaving. ? ?I recommend doing warm compresses to the area 4 times daily.  Keep the area clean with warm soap and water. ? ?If there is any chance that you could be pregnant please take a pregnancy test before taking the doxycycline to confirm that you are not pregnant. ? ? ?I have printed a prescription for an antibiotic.  What I recommend is that you wait 3 days and see if it is improving.  If it is you can throw away the prescription. ? ?Please use Tylenol or ibuprofen for pain.  You may use 600 mg ibuprofen every 6 hours or 1000 mg of Tylenol every 6 hours.  You may choose to alternate between the 2.  This would be most effective.  Not to exceed 4 g of Tylenol within 24 hours.  Not to exceed 3200 mg ibuprofen 24 hours.  ?

## 2021-11-17 NOTE — ED Provider Notes (Signed)
?[facility] EMERGENCY DEPARTMENT ?Provider Note ? ? ?CSN: [id number] ?Arrival date & time: 11/16/21  2247 ? ?  ? ?History ? ?Chief Complaint  ?Patient presents with  ? Insect Bite  ? ? ?Najwa JULIYAH MERGEN is a 26 y.o. female. ? ?HPI ?Patient is a twin 26 year old female with a past medical history significant for asthma ? ?She is presented emergency room today with complaints of wound to right thigh.  She states that she might have been bit by an insect but did not see an insect.  She states that the wound is been there for 3 days. ? ?Denies any fevers ? ?No pus has come out of the area. ?  ? ?Home Medications ?Prior to Admission medications   ?Medication Sig Start Date End Date Taking? Authorizing Provider  ?doxycycline (VIBRAMYCIN) 100 MG capsule Take 1 capsule (100 mg total) by mouth 2 (two) times daily for 7 days. 11/17/21 11/24/21 Yes Anelly Samarin, Rodrigo Ran, PA  ?albuterol (PROVENTIL HFA;VENTOLIN HFA) 108 (90 Base) MCG/ACT inhaler Inhale 2 puffs into the lungs every 6 (six) hours as needed for wheezing or shortness of breath. 06/03/17   Reva Bores, MD  ?cephALEXin (KEFLEX) 500 MG capsule Take 1 capsule (500 mg total) by mouth 3 (three) times daily. 05/18/19   Mardella Layman, MD  ?cetirizine (ZYRTEC ALLERGY) 10 MG tablet Take 1 tablet (10 mg total) by mouth daily. 06/24/17   Antony Madura, PA-C  ?HYDROcodone-acetaminophen (NORCO/VICODIN) 5-325 MG tablet Take 1 tablet by mouth every 4 (four) hours as needed. 06/16/21   Jacalyn Lefevre, MD  ?ibuprofen (ADVIL) 600 MG tablet Take 1 tablet (600 mg total) by mouth every 6 (six) hours as needed. 06/16/21   Jacalyn Lefevre, MD  ?predniSONE (STERAPRED UNI-PAK 21 TAB) 10 MG (21) TBPK tablet Take by mouth daily. Take 6 tabs by mouth daily  for 2 days, then 5 tabs for 2 days, then 4 tabs for 2 days, then 3 tabs for 2 days, 2 tabs for 2 days, then 1 tab by mouth daily for 2 days 06/16/21   Jacalyn Lefevre, MD  ?Prenatal Vit-Fe Fumarate-FA (PRENATAL MULTIVITAMIN) TABS  tablet Take 1 tablet by mouth daily at 12 noon. 08/10/18   Calvert Cantor, CNM  ?   ? ?Allergies    ?Patient has no known allergies.   ? ?Review of Systems   ?Review of Systems ? ?Physical Exam ?Updated Vital Signs ?BP 126/68   Pulse 62   Temp 98.5 ?F (36.9 ?C)   Resp 16   Ht 5\' 6"  (1.676 m)   Wt 104.3 kg   SpO2 100%   BMI 37.11 kg/m?  ?Physical Exam ?Vitals and nursing note reviewed.  ?Constitutional:   ?   General: She is not in acute distress. ?HENT:  ?   Head: Normocephalic and atraumatic.  ?   Nose: Nose normal.  ?Eyes:  ?   General: No scleral icterus. ?Cardiovascular:  ?   Rate and Rhythm: Normal rate and regular rhythm.  ?   Pulses: Normal pulses.  ?   Heart sounds: Normal heart sounds.  ?Pulmonary:  ?   Effort: Pulmonary effort is normal. No respiratory distress.  ?   Breath sounds: No wheezing.  ?Abdominal:  ?   Palpations: Abdomen is soft.  ?   Tenderness: There is no abdominal tenderness.  ?Musculoskeletal:  ?   Cervical back: Normal range of motion.  ?   Right lower leg: No edema.  ?   Left lower  leg: No edema.  ?   Comments: Full range of motion bilateral lower extremities  ?Skin: ?   General: Skin is warm and dry.  ?   Capillary Refill: Capillary refill takes less than 2 seconds.  ?   Comments: Small pimple surrounded by slightly erythematous skin approximately 3 to 4 cm ? ?Skin is not indurated.  ?Neurological:  ?   Mental Status: She is alert. Mental status is at baseline.  ?Psychiatric:     ?   Mood and Affect: Mood normal.     ?   Behavior: Behavior normal.  ? ? ?ED Results / Procedures / Treatments   ?Labs ?(all labs ordered are listed, but only abnormal results are displayed) ?Labs Reviewed - No data to display ? ?EKG ?None ? ?Radiology ?No results found. ? ?Procedures ?Ultrasound ED Soft Tissue ? ?Date/Time: 11/17/2021 8:58 AM ?Performed by: Gailen Shelter, PA ?Authorized by: Gailen Shelter, PA  ? ?Procedure details:  ?  Indications: localization of abscess   ?  Transverse view:   Visualized ?  Longitudinal view:  Visualized ?  Images: not archived   ?  Limitations:  Body habitus ?Location:  ?  Location: lower extremity   ?  Side:  Right ?Findings:  ?   abscess present ?Comments:  ?   Very small abscess located on ultrasound. ?..Incision and Drainage ? ?Date/Time: 11/17/2021 8:59 AM ?Performed by: Gailen Shelter, PA ?Authorized by: Gailen Shelter, PA  ? ?Consent:  ?  Consent obtained:  Verbal ?  Consent given by:  Patient ?  Risks, benefits, and alternatives were discussed: yes   ?  Risks discussed:  Bleeding, incomplete drainage, pain and infection ?  Alternatives discussed:  No treatment, delayed treatment, alternative treatment, observation and referral ?Location:  ?  Size:  <1 cm ?  Location:  Lower extremity ?  Lower extremity location:  Leg ?  Leg location:  R upper leg ?Pre-procedure details:  ?  Skin preparation:  Chlorhexidine ?Procedure type:  ?  Complexity:  Simple ?Procedure details:  ?  Incision type: Needle aspiration. ?  Drainage amount:  Scant ?Post-procedure details:  ?  Procedure completion:  Tolerated well, no immediate complications  ? ? ?Medications Ordered in ED ?Medications - No data to display ? ?ED Course/ Medical Decision Making/ A&P ?  ?                        ?Medical Decision Making ? ?Patient is a 26 year old female presenting today with a small abscess to her right anterior thigh.  Bedside ultrasound was done to localize very small abscess.  It showed significant rotation about incision and drainage.  Gave option for incision, needle aspiration, watchful waiting. ? ?Opted for needle aspiration. ? ?Small amount of fluid expressed after 18-gauge needle was used for aspiration. ? ?Patient tolerated procedure well no analgesia was required however was offered. ? ?We will provide with a prescription for doxycycline the small amount of erythema surrounding the skin was circled by bedside RN. ? ?Watchful waiting for 3 days.  If erythema does not resolve will  initiate doxycycline otherwise warm compresses and follow-up with PCP. ? ?Final Clinical Impression(s) / ED Diagnoses ?Final diagnoses:  ?Abscess  ? ? ?Rx / DC Orders ?ED Discharge Orders   ? ?      Ordered  ?  doxycycline (VIBRAMYCIN) 100 MG capsule  2 times daily       ? 11/17/21  [id number]  ? ?  ?  ? ?  ? ? ?  ?Gailen ShelterFondaw, Lovelle Lema S, [place] ?11/17/21 0901 ? ?  ?Gwyneth SproutPlunkett, [place], MD ?11/17/21 2052 ? ?

## 2021-12-03 ENCOUNTER — Other Ambulatory Visit: Payer: Self-pay | Admitting: Family Medicine

## 2021-12-03 DIAGNOSIS — N6310 Unspecified lump in the right breast, unspecified quadrant: Secondary | ICD-10-CM

## 2021-12-26 ENCOUNTER — Other Ambulatory Visit: Payer: Medicaid Other

## 2021-12-26 ENCOUNTER — Ambulatory Visit
Admission: RE | Admit: 2021-12-26 | Discharge: 2021-12-26 | Disposition: A | Discharge status: Home / Self Care | Payer: Medicaid Other | Source: Ambulatory Visit | Attending: Family Medicine | Admitting: Family Medicine

## 2021-12-26 DIAGNOSIS — N6310 Unspecified lump in the right breast, unspecified quadrant: Secondary | ICD-10-CM

## 2022-01-14 DIAGNOSIS — L308 Other specified dermatitis: Secondary | ICD-10-CM | POA: Insufficient documentation

## 2022-01-14 DIAGNOSIS — E559 Vitamin D deficiency, unspecified: Secondary | ICD-10-CM | POA: Insufficient documentation

## 2022-01-14 HISTORY — DX: Morbid (severe) obesity due to excess calories: E66.01

## 2022-01-14 HISTORY — DX: Vitamin D deficiency, unspecified: E55.9

## 2022-01-14 HISTORY — DX: Other specified dermatitis: L30.8

## 2022-07-08 IMAGING — US US BREAST*R* LIMITED INC AXILLA
1 series · 6 of 6 positions shown · non-contrast
Comparison: None Available.

CLINICAL DATA: 25-year-old female presenting for evaluation of a
lump felt by the patient's physician in the right breast at 9
o'clock.

EXAM:
ULTRASOUND OF THE RIGHT BREAST

[Series 1: us breast*right* limited inc axilla · 0.06mm/px · 6 of 6 slices shown]
[im 1/6]
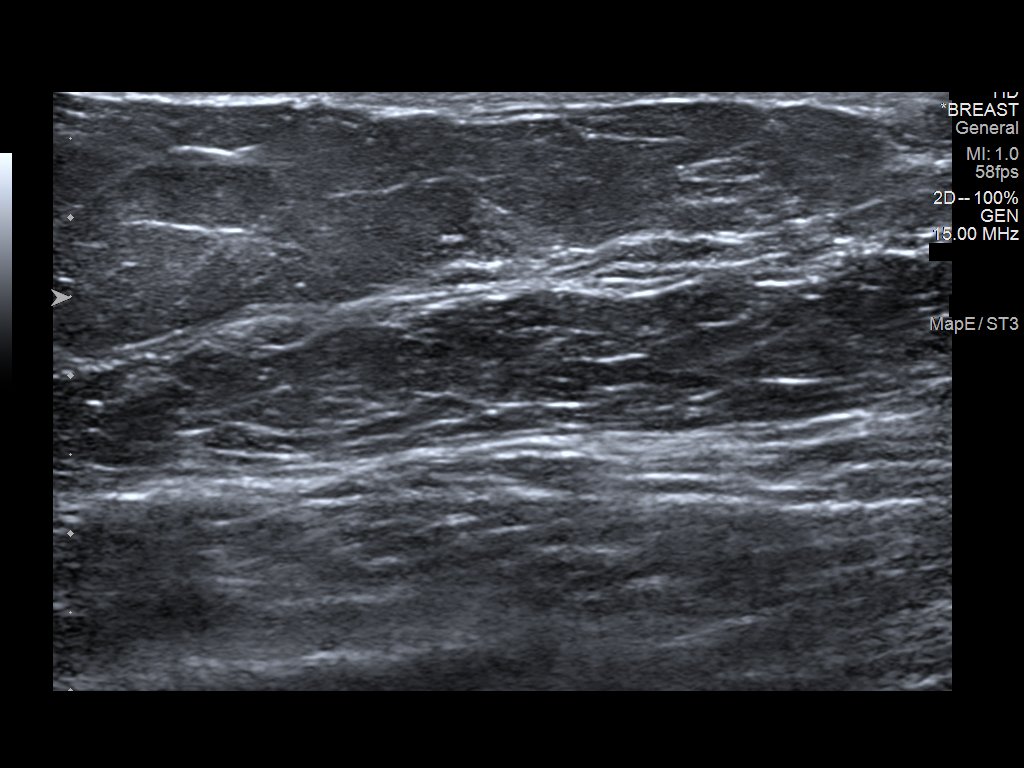
[im 2/6]
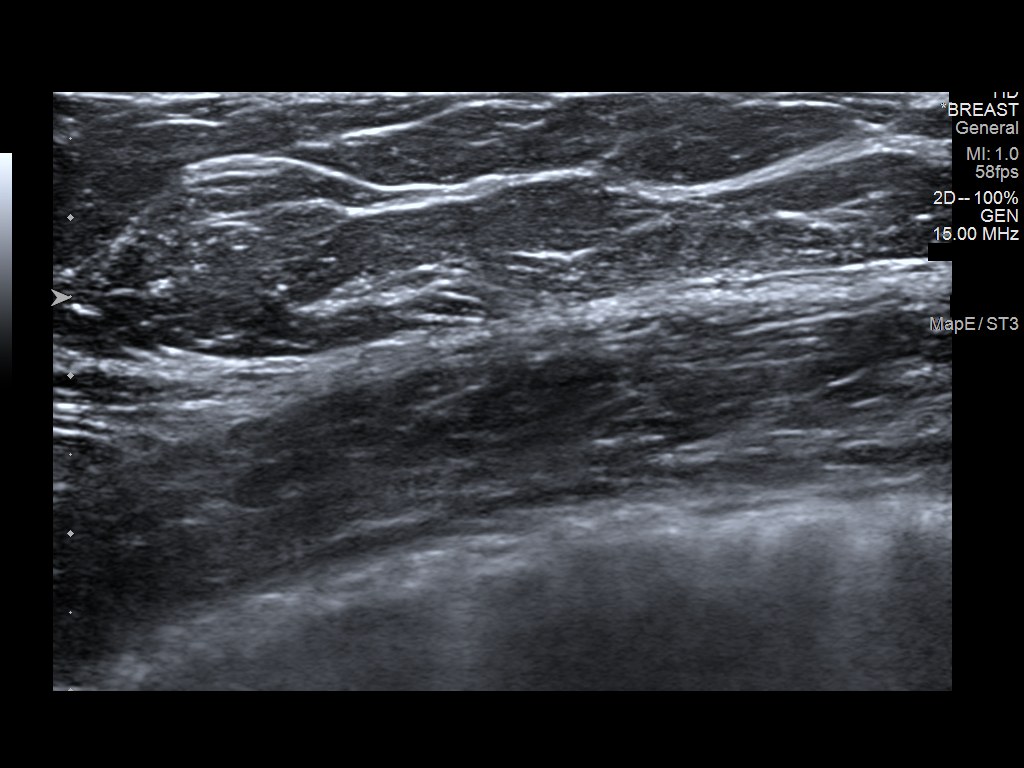
[im 3/6]
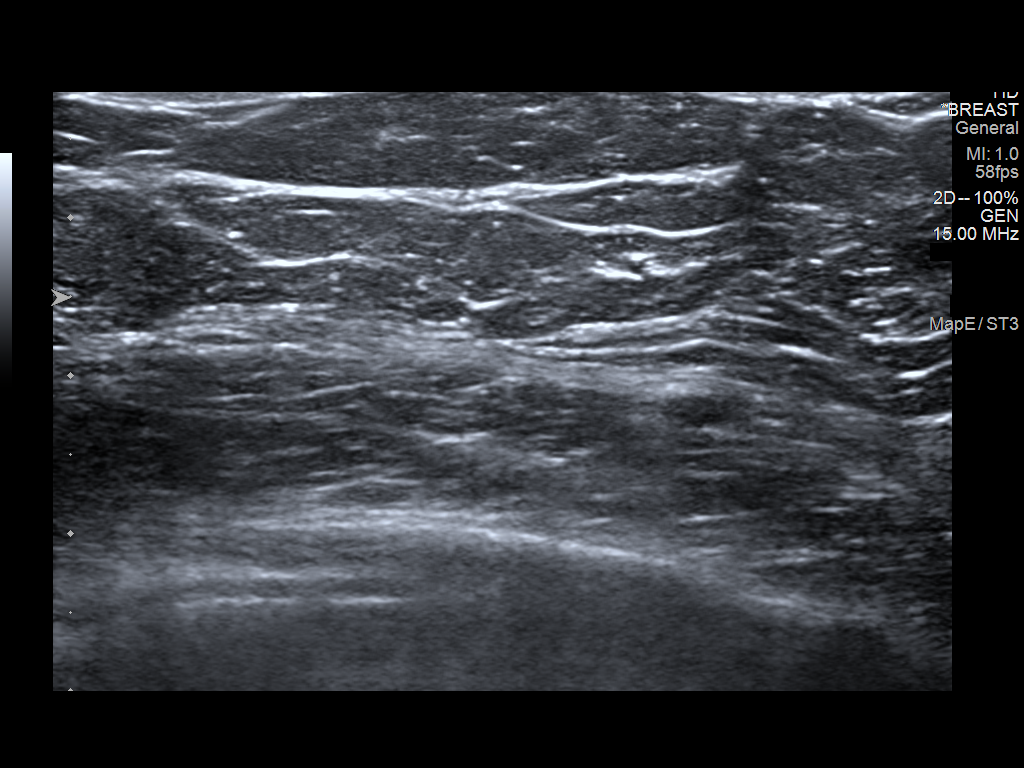
[im 4/6]
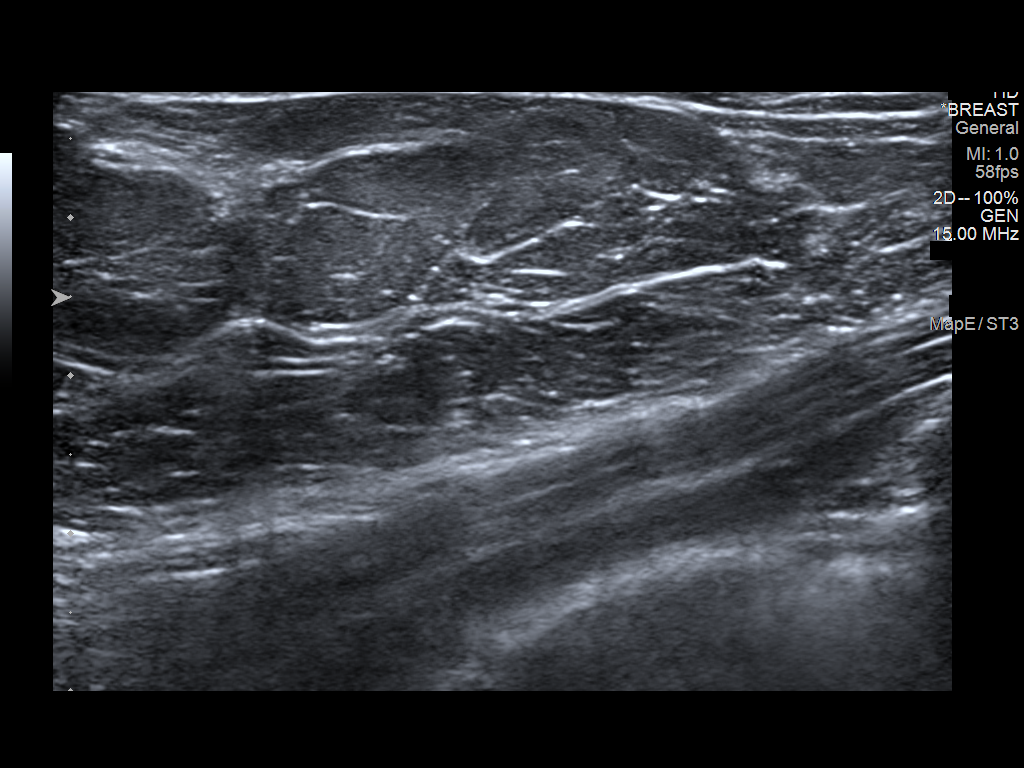
[im 5/6]
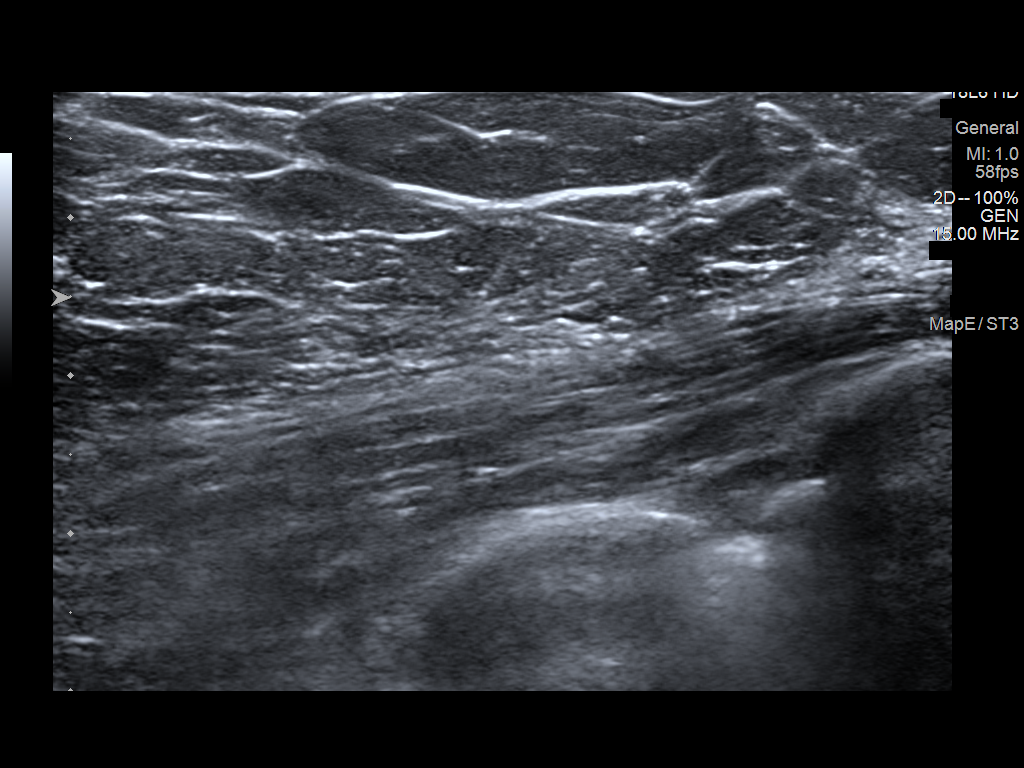
[im 6/6]
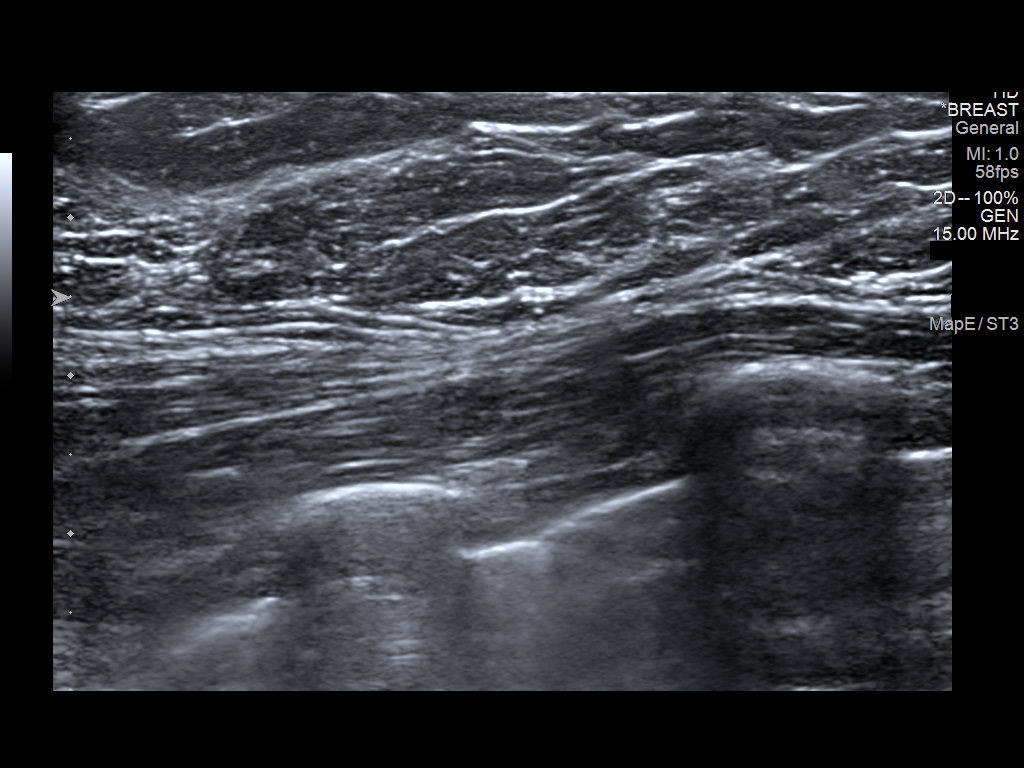

[6 of 6 positions shown; findings below may reference images not displayed]

FINDINGS: On physical exam of the outer right breast I do not feel a discrete
mass or focal area of thickening.

Targeted ultrasound is performed at the palpable site in the right
breast at 9 o'clock demonstrating no cystic or solid mass.
IMPRESSION: No sonographic evidence of malignancy at the palpable site of
concern in the outer right breast.

RECOMMENDATION:
1. Recommend any further workup of the palpable site in the right
breast be on a clinical basis.

2. Continue routine clinical breast exams and begin screening
mammography at age 38.

I have discussed the findings and recommendations with the patient.
If applicable, a reminder letter will be sent to the patient
regarding the next appointment.

BI-RADS CATEGORY  1: Negative.

## 2023-02-06 DIAGNOSIS — R058 Other specified cough: Secondary | ICD-10-CM

## 2023-02-06 HISTORY — DX: Other specified cough: R05.8

## 2023-08-24 ENCOUNTER — Emergency Department (HOSPITAL_COMMUNITY)
Admission: EM | Admit: 2023-08-24 | Discharge: 2023-08-25 | Discharge status: Against Medical Advice | Payer: Medicaid Other | Attending: Emergency Medicine | Admitting: Emergency Medicine

## 2023-08-24 ENCOUNTER — Other Ambulatory Visit: Payer: Self-pay

## 2023-08-24 ENCOUNTER — Encounter (HOSPITAL_COMMUNITY): Payer: Self-pay | Admitting: Emergency Medicine

## 2023-08-24 DIAGNOSIS — Z3201 Encounter for pregnancy test, result positive: Secondary | ICD-10-CM | POA: Diagnosis present

## 2023-08-24 DIAGNOSIS — Z5321 Procedure and treatment not carried out due to patient leaving prior to being seen by health care provider: Secondary | ICD-10-CM | POA: Diagnosis not present

## 2023-08-24 LAB — PREGNANCY, URINE: Preg Test, Ur: POSITIVE — AB

## 2023-08-24 NOTE — ED Provider Triage Note (Cosign Needed)
Emergency Medicine Provider Triage Evaluation Note  Erin Tran , a 27 y.o. female  was evaluated in triage.  Pt complains of concern for menstrual cycle. States missed menses. LMP was 11/23. Is lamira and metformin. No abdominal pain or bleeding.  Review of Systems  Positive: nothing Negative: Abdominal pain  Physical Exam  There were no vitals taken for this visit. Gen:   Awake, no distress   Resp:  Normal effort  MSK:   Moves extremities without difficulty Other:   Medical Decision Making  Medically screening exam initiated at 8:44 PM.  Appropriate orders placed.  Erin Tran was informed that the remainder of the evaluation will be completed by another provider, this initial triage assessment does not replace that evaluation, and the importance of remaining in the ED until their evaluation is complete    Pete Pelt, [place] 08/24/23 2044

## 2023-08-25 NOTE — ED Notes (Signed)
Called pt multiple times for vitals, no response.  

## 2023-08-25 NOTE — ED Triage Notes (Signed)
Pt requesting ultrasound for pregnancy confirmation.  Pt offered urine pregnancy test and advised too early for [place] if her LMP was in November.  Pt agreed to plan for urine preg test.

## 2023-10-08 ENCOUNTER — Telehealth: Payer: Medicaid Other

## 2023-10-08 DIAGNOSIS — O0991 Supervision of high risk pregnancy, unspecified, first trimester: Secondary | ICD-10-CM | POA: Diagnosis not present

## 2023-10-08 DIAGNOSIS — Z3A11 11 weeks gestation of pregnancy: Secondary | ICD-10-CM

## 2023-10-08 DIAGNOSIS — O0990 Supervision of high risk pregnancy, unspecified, unspecified trimester: Secondary | ICD-10-CM | POA: Insufficient documentation

## 2023-10-08 MED ORDER — BLOOD PRESSURE KIT DEVI: 1.0000 | 0 refills | Status: AC | PRN

## 2023-10-08 NOTE — Patient Instructions (Signed)
Safe Medications in Pregnancy   Acne:  Benzoyl Peroxide  Salicylic Acid   Backache/Headache:  Tylenol: 2 regular strength every 4 hours OR               2 Extra strength every 6 hours   Colds/Coughs/Allergies:  Benadryl (alcohol free) 25 mg every 6 hours as needed  Breath right strips  Claritin  Cepacol throat lozenges  Chloraseptic throat spray  Cold-Eeze- up to three times per day  Cough drops, alcohol free  Flonase (by prescription only)  Guaifenesin  Mucinex  Robitussin DM (plain only, alcohol free)  Saline nasal spray/drops  Sudafed (pseudoephedrine) & Actifed * use only after [redacted] weeks gestation and if you do not have high blood pressure  Tylenol  Vicks Vaporub  Zinc lozenges  Zyrtec   Constipation:  Colace  Ducolax suppositories  Fleet enema  Glycerin suppositories  Metamucil  Milk of magnesia  Miralax  Senokot  Smooth move tea   Diarrhea:  Kaopectate  Imodium A-D   *NO pepto Bismol   Hemorrhoids:  Anusol  Anusol HC  Preparation H  Tucks   Indigestion:  Tums  Maalox  Mylanta  Zantac  Pepcid   Insomnia:  Benadryl (alcohol free) 25mg  every 6 hours as needed  Tylenol PM  Unisom, no Gelcaps   Leg Cramps:  Tums  MagGel   Nausea/Vomiting:  Bonine  Dramamine  Emetrol  Ginger extract  Sea bands  Meclizine  Nausea medication to take during pregnancy:  Unisom (doxylamine succinate 25 mg tablets) Take one tablet daily at bedtime. If symptoms are not adequately controlled, the dose can be increased to a maximum recommended dose of two tablets daily (1/2 tablet in the morning, 1/2 tablet mid-afternoon and one at bedtime).  Vitamin B6 100mg  tablets. Take one tablet twice a day (up to 200 mg per day).   Skin Rashes:  Aveeno products  Benadryl cream or 25mg  every 6 hours as needed  Calamine Lotion  1% cortisone cream   Yeast infection:  Gyne-lotrimin 7  Monistat 7    **If taking multiple medications, please check labels to avoid  duplicating the same active ingredients  **take medication as directed on the label  ** Do not exceed 4000 mg of tylenol in 24 hours  **Do not take medications that contain aspirin or ibuprofen           [facility] Providers  [place] ([id number]) [facility] Manson Passey, MD; Deirdre Priest, MD; Lum Babe, MD; Leveda Anna, MD; McDiarmid, MD; Jerene Bears, MD [address] [place] [place], [place] [address] [phone] Mon-Fri 8:30-12:30, 1:30-5:00  Providers come to see babies during newborn hospitalization Only accepting infants of Mother's who are seen at [facility] or have siblings seen at   [facility] Medicaid - Yes; Tricare - Yes   [facility] [place], MD [address], [place], [place] [address] [phone] Mon, Tue, Thur, Fri 8:30-5:00, Wed 10:00-7:00 (closed 1-2pm daily for lunch) [facility] residents with no insurance.  [place] [employer] only with Medicaid/insurance; Tricare - no  [facility]) - [facility], MD; Manson Passey, MD; Ave Filter, MD; Luna Fuse, MD; Kennedy Bucker, MD; Florestine Avers, MD; Melchor Amour, MD; Yetta Barre,  MD; Konrad Dolores, MD; Kathlene November, MD; Jenne Campus, MD; Wynetta Emery, MD; Duffy Rhody, MD; Gerre Couch, NP [address], [place], [place] [address] [phone] Mon, Tue, Thur, Fri 8:30-5:30, Wed 9:30-5:30, Sat 8:30-12:30 Only accepting infants of first-time parents or siblings of current patients  Hospital discharge coordinator will make follow-up appointment Medicaid - yes; Tricare - yes  [place] ([address]) [facility], MD; Earlene Plater, MD; Jamesetta Orleans, MD; Alvera Novel, MD; Rana Snare, MD; [facility], MD; Norris, MD; Hosie Poisson, MD; Mayford Knife, MD [address] [place], [place] [address] [phone] Mon-Fri 8:30-5:00, closed for lunch 12:30-1:30; Sat-Sun 10:00-1:00 Accepting Newborns with commercial insurance only, must call prior to  delivery to be accepted into  practice.  Medicaid - no, Tricare - yes   [facility] [address] [place], [place] [address] [phone] or [phone] Mon to Fri 8am to 10pm, Sat 8am to 1pm (virtual only on weekends) Only accepts Medicaid Healthy Blue pts  [facility] ([facility]) - Pediatrics at Elige Radon, MD; Sabino Dick, MD; Quitman Livings, MD; Betha Loa, NP; Claretha Cooper, MD; Lelon Perla, MD [address] [place], [place] [address] [phone] Mon-Fri 8:30-5:30 Medicaid - yes, Tricare - yes  [place] [phone]) [facility] Mowers, MD [address], [place], [place] [address] [phone] Iona Hansen, Wed Fri 8:30-5:00, Sat 8:30-12:00, Closed Thursdays Accepting siblings of established patients and first time mom's if you call prenatally Medicaid- yes; Tricare - yes  [facility] Lutricia Feil, [place]; Tracie Harrier, MD; Rusty Aus; Scifres, PA; Wynelle Link, MD; Azucena Cecil, MD;  [address] [place], [place] [address] [phone] Mon-Fri 8:30-5:00, closed for lunch 1-2 Only accepting newborns of established patients Medicaid- no; Tricare - yes  [facility] [phone]) [facility] Morene Crocker, MD; [address], [place], [place] [address] [phone] Mon-Fri 8:00-5:00 Medicaid - No; Tricare - Yes  [facility], [place]; [place], [place] [address] [place], [place] [address] [phone] Mon-Fri 8:00-5:00 Medicaid - No, Tricare - Yes  [facility] Cardell Peach, MD; Nash Dimmer, MD; [place], [place] [address] [place], [place] [address] [phone]  Mon-Fri 8:00-5:00 Medicaid - No; Tricare - Yes  [facility] [address] [place], [place] [address] [phone] Mon-Fri 8:30-5:00 (lunch 12:00-1:00) Medicaid -Yes; Tricare - Yes  [facility] [place], MD [address] [place],  [place], [place] [address] [phone] Mon-Fri 8:00-5:00 Seeing newborns of current patients only. No new patients Medicaid - No, Tricare - yes  Nature conservation officer at [place] [address], MD [address] [place], [place] [address] [phone] Mon-Fri 8:00-5:00 Medicaid -yes as secondary coverage only; Tricare - yes  [facility] [place], [place]; [place], [place]; Avis Epley, MD; Vonna Kotyk, MD; Clance Boll, MD; [place], [place]; [place], NP; Vaughan Basta, MD; [place], MD [address] [place], [place] [address] [phone] Mon-Fri 8:30-5:00, Sat 9:00-11:00 Accepts commercial insurance ONLY. Offers free prenatal information sessions for families. Medicaid - No, Tricare - Call first  [facility] [place], MD; [place], [place]; Oracle, [place]; [place], [place] [address] [place] [address] [phone] Mon-Fri 7:30-5:30 Medicaid - Yes; Ailene Rud yes  [place] [phone] & [phone])  [facility], MD [address], [place], [place] [address] [phone] Mon-Thur 8:00-6:00, closed for lunch 12-2, closed Fridays Medicaid - yes; Tricare - no  [facility] Dareen Piano, NP; Cyndia Bent, MD; [place], [place]; [place], [place] [address], [place], [place] [address] [phone] Mon-Fri 7:30-4:30 Medicaid - yes, Tricare - yes  [facility]  Juanito Doom, MD; Janene Harvey, NP; Vonita Moss, MD; Donn Pierini, NP [address], [place], [place] [address] [phone] Mon-Fri 8:30-5:00, closed for lunch 1-2, Sat 8:30-12:00 - sick visits only Providers come to see babies  at [facility] Only accepting newborns of siblings and first time parents ONLY if who have met with office prior to delivery Medicaid -Yes; Tricare - yes  [facility] - [place], [place]; Spero Geralds, NP; Earlene Plater, MD; Lucretia Roers, MD:  [address], [place], [place] [address] [phone] Mon- Fri 8:00-5:00, Sat 9:00-12:00 - sick  visits only Accepting siblings of established patients and first time mom/baby Medicaid - Yes; Tricare - yes Patients must have vaccinations (baby vaccines)  [place] [phone] & [phone])  Adult nurse HealthCare at [facility] [address] [place], [place] [address] [phone] Mon-Fri 8:00-5:00 Medicaid - no; Tricare - yes  [facility] [place], MD; [place], [place]; [place], [place] [address], [place], [place] [address] [phone] Mon-Fri 8:00-5:00 Medicaid- yes; Tricare - yes  [facility] - Ardeen Jourdain, MD; Yetta Barre, NP; [place], [place] [address] [place], [place], [place] [address] [phone] Mon-Fri 8:00-5:00 Medicaid - Yes; Tricare - yes  [address] [place] [phone])  [facility] [place], [place]; [place], [place]; Eddie Candle, MD; Normand Sloop, MD; India Hook, NP; Isenhour, DO; [place], [place]; Constance Goltz, MD; Ruthann Cancer, MD; Vear Clock, MD; [place], [place]; [place], [place]; [place], [place] [address] [facility] [address], [place], [place] [address] [phone] Mon-Fri 8:30-5:00, Sat 9:00-12:00 - sick only Please register online [email or web address] then schedule online or call office Medicaid-Yes; Tricare -yes  [facility] - [place]  [place], MD; Romualdo Bolk, MD; [place], MD; [place], NP; [place], [place]; Antonietta Barcelona, MD; Mayford Knife, NP; Shelva Majestic, MD [address], [place], [place] [address] [phone] Mon-Fri 8:00-5:30, Sat&Sun by appointment (phones open at 8:30) Medicaid - Yes; Tricare - yes  [place] [phone] & [phone]) [facility] Mariel Aloe; [place], MD; Roger Shelter, MD; Arvilla Market, NP; [place], DO [address], [place], [place] [address] [phone] M-F 8:00 - 5:15, Sat/Sun 9-12 sick visits only Medicaid - No; Tricare - yes  [facility]  [address] [place], PA-C; Emerson, PA-C; [place], DO; Cameron, PA-C; Bay, PA-C; Roselyn Bering, MD [address], [place], [place] [address] [phone] Mon-Thur 8:00-7:00, Fri 8:00-5:00 Accepting Medicaid for 13 and under only   [facility] - Family Medicine at [place] [facility] [place]) [place], [place]; List, FNP; Berneda Rose, MD; Luther Redo, PA-C; Lavonia Drafts, MD; Kellie Simmering, FNP; Genevie Cheshire, FNP; Evaristo Bury, MD; Berneda Rose, MD [phone] [address] [place], [place] [address] [phone] Mon-Fri 8:30-5:30 Medicaid - Yes; Tricare - yes  [facility] - [address]  [place], [place]; Whitney Post, MD; Hennie Duos, MD; Wynne Dust, MD; Playita Cortada, NP [address], [place], [place] [address] [phone] Mon-Thur 8:00-5:30, Fri 8:00-5:00, Sat 9:00-12:00 Medicaid - yes, Tricare - yes  [place] [phone])  [facility], [place]; Lenise Arena, MD; [place], [place] [address], [place], [place] [address] [phone] Mon-Fri 8:00-5:00, closed for lunch 12-1 Medicaid - No; Tricare - yes  Nature conservation officer at [facility] [place], MD [address] [place], [place] [address] [phone] Mon-Fri 8:00-5:00 Medicaid - No; Tricare - yes  [facility] - [facility] [place], MD; Tami Ribas, MD; Mariam Dollar, MD; Yetta Barre, MD [address], [place], [place] [address] [phone] Mon-Fri 8:00-5:00 Medicaid- Yes; Tricare - yes  [place] [phone])  Adult nurse HealthCare at [facility], [place]; [place], MD [address] [place], [place], [place] [address] [phone]  Mon-Fri 8:00-5:00 Medicaid - No; Tricare - yes  [facility] Medicine - [facility] - CPNP [address] [place], [place] [address] [phone] Mon-Weds 8:00-6:00, Thurs-Fri 8:00-5:00, Sat 9:00-12:00 Medicaid - yes; Tricare - yes   [facility] Katharina Caper, MD; [place], [place] [address] [place], [place] [address] [phone] Mon-Fri 8:00-5:00 Medicaid - yes; Tricare - yes  [facility]  Pediatric Providers  [facility] [address] [place], [place] [address] [phone] Sheral Flow: 8am -8pm, Tues, Weds: 8am - 5pm; Fri: 8-1 Medicaid - Yes; Tricare - yes  [facility] Rachel Bo, MD; Laural Benes, MD; Anner Crete, MD; [place], [place]; [employer], [place] [address], [place], [place] [address] [phone] M-F 8:30 - 5:00 Medicaid - Call office; Tricare -yes  [facility] Edson Snowball, MD; Shanon Rosser, MD, Chelsea Primus, MD; Shirlyn Goltz, PNP; Wardell Heath, NP [phone] [address] [place], [place] [address] [phone] M-F 8:30 - 5:00, Sat/Sun 8:30 - 12:30 (sick visits) Medicaid - Call office; Tricare -yes  [facility] Melvyn Neth, MD; Karl Luke, PNP; Princess Bruins, MD; [place], [place]; [place], NP; Cynda Familia [address], [place], [place] [address] [phone] M-F 8:30 - 5:00 Medicaid - Call office; Tricare - yes  [facility] Jesusita Oka, MD; Dierdre Highman, MD; Earnest Conroy, MD; Timothy Lasso, MD; [place], MD [phone] [address], [place], [place] [address] [phone] M-Thur: 8:00 - 5:00; Fri: 8:00 - 4:00 Medicaid - yes; Tricare - yes  [facility] [address] [place], [place] [address] [phone] M-F: 8:30- 5:00, closed for lunch 12:30 - 1:00 Medicaid - yes; Tricare -yes  [facility] [address], [place], [place] [address] [phone] M-F 8:00 - 5:00 Medicaid - yes; Tricare - yes  [place] [facility], DO; [place], DO; Benedict, NP [address] [place], [place] [address] [phone] M-F 8:00 - 5:00, Closed 12-1 for lunch Medicaid - Call; Tricare - yes  [facility] - Pediatrics Meredith Mody, MD [address] [place], [place] [address] [phone] M-F: 8:00-5:00, Sat: 8:00 - noon Medicaid - call; Tricare -yes  [facility] Pediatric Providers  [facility] Madison Hights [place], [place] [address] [place], [place], [place] [address] [phone] M-W: 8:00-5:00, Thur: 8:00 -  7:00, Fri: 8:00 - noon Medicaid - yes; Tricare - yes  [facility] - Quay Burow, FNP [address], [place], [place] [address] [phone] M-F 8:00 - 5:00, Closed for lunch 12-1 Medicaid - yes; Tricare - yes  [facility] Pediatric Providers  [facility] Primary Care at [place], [place], Alinda Money, MD, [place], FNP-C [address], [facility], [address], [place], [place] [address] [phone] M-T 8:00-5:00, Wed-Fri 7:00-6:00 Medicaid - Yes; Tricare -yes  [facility] Family [facility] at [facility], DO; [address] [place] [place]er [place], [place] [address] [phone] M-F 8:00 - 5:00, closed for lunch 12-1 Medicaid - Yes; Tricare - yes  [facility] and Internal Medicine  Zachery Dauer, MD; Gladstone Lighter, MD; Collie Siad, MD; Freda Jackson, MD; Rich Number, MD; Darryl Nestle, MD; Melinda Crutch, MD, Audria Nine, MD; Tawanna Cooler, MD; Steffanie Dunn, MD; Byrd Hesselbach, MD; Lucretia Roers, MD [address] [place], [place] [address] [phone] M-F 8:00-5:00 Medicaid - yes; Tricare - yes  [facility] [place], MD (speaks [place] and Hindi) [address] [place], [place] [address] [phone] M-F: 8:30 - 5:00, closed 12:30 - 1 for lunch Medicaid - Yes; Tricare -yes  [facility] Pediatric Providers  [facility] Shanda Bumps, MD; Chanetta Marshall, MD; Laurell Josephs, MD  [address], [place], [place] [address] [phone] M-Th: 8:00 - 5:30, Fri: 8:00 - 12:00 Medicaid - yes; Tricare - yes  [facility]s at [place] [facility], NP; Thora Lance, MD; Orrin Brigham, MD [phone], [place], [place] [address] [phone] M-F: 8:00 - 5:00 Medicaid - yes; Tricare - yes  [facility] [place], NP; Orson Slick, NP; Salley Scarlet, NP; Linton Flemings, MD; Mayford Knife, MD, [place], NP, Emelda Fear, MD; Nida Boatman [address] [place], [place] [address] [phone] M-F: 8:30 - 5:30p Medicaid - yes; Tricare - yes Other locations available as well  [facility], MD; Andrey Campanile, MD; Neville Route, PA-C [address] [place], [place] [address] [phone] M-W: 8:00am - 7:00pm, Thurs: 8:00am - 8:00pm; Fri: 8:00am - 5:00pm, closed daily from 12-1 for lunch Medicaid - yes; Tricare - yes  [facility] Pediatric Providers  [facility] Pediatrics at Levin Erp, MD; Aggie Cosier, FNP; Bland Span, MD; Tristan Schroeder, MD; [place], PNP; Alesia Banda; [place], [place]; Julian Reil, MD;  [address] [place], [place] [address] [phone] Judie Petit - Caleen Essex: 8am - 5pm, Sat 9-noon Medicaid - Yes; Tricare -yes  [facility] at Jaclynn Guarneri, MD; Yetta Barre, FNP; Lilian Kapur, MD; Mariam Dollar, MD [address] [place], [place] [phone] M-F 8:00 - 5:00 Medicaid - call; Tricare - yes  [facility]- Cruz Condon, MD; [place], [place]; Delora Fuel, MD; Dareen Piano, MD; Trudee Grip, MD; Kizzie Ide, MD; Zebedee Iba; Birdena Crandall, MD; Hinton Dyer, MD; [place], MD [address], [place], [place] [address] [phone] M-F 8:00am - 5:00pm; Sat. 9:00 - 11:00 Medicaid - yes; Tricare - yes  [facility] at [facility], MD [address], [place], [place] [address] [phone] M-F 8:00 - 5:00 Medicaid - [place] Medicaid only; Tricare - yes  [facility] - Illene Bolus, MD; Earlene Plater, [place]; Kenyon Ana, MD [address], [place], [place] [address] [phone] M-F 8:00 - 5:00 Medicaid - yes; Tricare - yes  [facility] [address] Pediatrics - Lind Covert, MD; Manson Passey, MD, [facility], MD, [place], MD; [place], MD; Katrinka Blazing, MD; [address] [place], [place] [address] [phone] M-F: 8-5 Medicaid - yes; Tricare - yes  [facility] - Henrietta Hoover, [employer]; [place], MD; [address], [place], [place] [address] [phone] M-F 8-5 Medicaid - yes; Tricare - yes  [address] [place] Darrol Poke, MD; Tami Ribas, MD; [place], MD; [place], DNP;  [place], PNP [address], [place], [place] [address] [phone] M-F 8-5 Medicaid - yes; Tricare - yes  [facility] and Pediatrics Delories Heinz, MD; Adrienne Mocha; Ala Bent, MD [address] [place], [place] [address] [phone] M-F 7am - 5 pm Medicaid - call; Tricare - yes  [facility] [place], [place]; Fredia Beets, MD; Roxan Hockey, MD [address] [place], [place] [address] [phone] M-F 8-5 Medicaid - yes; Tricare - yes  [facility] Kae Heller, MD; Sheliah Hatch, MD; Mayford Knife, FNP; Shon Baton, FNP; Tyron Russell, FNP; Ishmael Holter; [facility] - FNP [address], [place], [place] [address] [phone] M-F 8-5 Medicaid- yes; Tricare - yes  [facility] - Betsy Coder, [place] and Chalmers Guest, MD; Terrial Rhodes, MD; Hulda Humphrey, MD; Roseanne Reno, MD; [place], [place]; Ala Dach, MD; Fredia Beets, MD; Dimple Casey, MD [address], [place], [place] [address] [phone] M-F: 8-5, Sat: 9-4, Sun 9-12 Medicaid - yes; Tricare - yes  [facility] [place], PNP; [place], PNP [address] [place], [place]win Nora [place], [place]  [address] [phone] M-F 8 - 5, closed 12-1 for lunch Medicaid - yes; Tricare - yes  [facility] Kathyrn Lass, MD; Hal Neer, MD; Dimple Casey, MD; [place], DO [address], [place], [place] [address] [phone] M-F 8- 5:30 Medicaid - yes; Tricare - yes  [facility] - Biagio Quint, MD; Rosalia Hammers, MD; Gwenith Daily, MD [address] [place], [place] [address] [phone] Judie Petit: Nicholas Lose; Tues-Fri: 8-5; Sat: 9-12 Medicaid - yes; Tricare - yes  [facility] Pediatrics - Bobbye Morton, MD; Daphane Shepherd, MD; Chestine Spore, MD; Haskell Riling, MD; Kate Sable, MD [address] [place], [place] [address] [phone] Judie PetitMarland Kitchen Nicholas LoseFrancee Nodal: 8-5; Sat: 8:30-12:30 Medicaid - yes;  Tricare - yes  [facility]s - Beckey Rutter, MD; [place], [place] [address] [place], [place] [address] [phone] Mon-Fri: 8-5 Medicaid - yes; Tricare - yes  [facility] Pediatrics - [place] Patterson, CPNP; [place], [place]; Dimple Casey, MD; Alisa Graff, MD; Cephus Shelling, MD; [address] [place], [place] [address] [phone] M-F: 8-5, closed 1-2 for lunch Medicaid - yes; Tricare - yes  [facility]elle [facility] - [facility] [place], [place]; [place], [place]; Katrinka Blazing, MD; [place], CPNP; [place], [place]; [place], MD; Earlene Plater, MD [address], [place], [place] [address] [phone] M-Thurs: Nicholas Lose; Fri: 8-6; Sat: 9-12; Sun 2-4 Medicaid - yes; Tricare - yes  [facility] Georgeanna Lea, MD; Evette Cristal, MD; Shea Stakes, FNP; Earney Mallet, DO; [address], [place], [place] [address] [phone] M-F: 8-5 Medicaid - yes; Tricare - yes  [facility] Pediatric Providers  [facility] - Family Medicine -Collene Mares, MD; [place], NP [address] [place], [place] [address] [phone] M - Fri: 8am - 5pm, closed for lunch 12-1 Medicaid - Yes; Tricare - yes  [facility] and Pediatrics Elinor Parkinson, MD; Victory Dakin, MD; Sanger, DO; Vinocur, MD;Hall, PA; Clent Ridges, [place]; Orvan Falconer, NP [phone] [address], [place], [place] [address] [phone] M-F 8:00 - 5:00, Sat 8:00 - 11:30 Medicaid - yes; Tricare - yes  [facility] [address] [facility] Welton Flakes, MD; [place], MD, [address] MD, [place], MD, [place], MD; [place]t, NP; [place], [place];  [address] [place], [place] [address] [phone] M-F 8:10am - 5:00pm Medicaid - yes; Tricare - yes  [facility] Eustaquio Boyden, MD; Anton Chico, NP [address], [place], [place] [address] [phone] M-F 8:00 - 5:00 Medicaid - [place] [place] Medicaid only; Tricare - yes  [facility] - [address] [place], MD; Center, NP [address], [place], [place] [address] [phone] M-F 8:00 - 5:00; Closed for lunch 12 - 1:00 Medicaid - yes; Tricare - yes  [facility] Belva Crome, MD; Jonita Albee, FNP [address] [place], [place] [address] [phone] Mon 9-5; Tues/Wed 10-5; Thurs 8:30-5; Fri: 8-12:30 Medicaid - yes; Tricare - yes  [facility] Pediatric Providers  [facility]  [place], MD; [place], [place] [address], [place], [place] [address] [phone] phone [phone] fax M-F 7:15 - 4:30 Medicaid - yes; Tricare - yes  [facility] Karilyn Cota, MD; [place], DO [address], [place], [place] [address] [phone] M-Fri: 8:30 - 5:00, closed for lunch everyday noon - 1pm Medicaid - Yes; Tricare - yes  [facility] Bur[place], MD; Reuel Boom, MD; Dimas Aguas, MD; Neita Carp, MD; [place], [place]; Bonnita Nasuti, [place]; [place], [place]; [place], [place];  [place], PA [address] [place], [place] [address] [phone] M-Thurs: 7:30am - 7:00pm; Friday 7:30am - 4pm; Sat: 8:00 - 1:00 Medicaid - Yes; Tricare - yes  [facility] Morton, MD; Conni Elliot, MD; Carroll Kinds, MD; [place], DO [address], [place], [place] [address] [phone] M-Thur: 8:00 - 5:00, Fri: 8:00 - Noon Medicaid - yes; Tricare - yes No [place] [employer]  [facility] [place], MD; Nadine Counts, DO; [place], NP; Daphine Deutscher, NP; Lequita Halt, NP; Ellamae Sia, NP; Reginia Forts, NP; Darlyn Read, MD; [place], [place] [address] [place], [place] [address] [phone] M-F 8:00 - 5:00 Medicaid - yes; Tricare - yes  [facility], FNP-C; [place], FNP-C [address] [place], [place] [address] [phone] M, W, R 8:00-5:00, Tues: 8:00am - 7:00pm; Fri 8:00 - noon Medicaid - Yes; Tricare - yes  [facility], MD [address] [place], [place] [address] [phone]  M-Thurs 8:30-5:30, Fri: 8:30-12:30pm Medicaid - Yes; Tricare - N    Considering Waterbirth? Guide for patients at Center for [facility]) Why consider waterbirth? Gentle birth for babies  Less pain medicine used in labor  May allow for passive descent/less pushing  May reduce perineal tears  More mobility and instinctive maternal position changes  Increased maternal relaxation   Is waterbirth safe? What are the risks of infection, drowning or other complications? Infection:  Very low risk (3.7 % for tub vs 4.8% for bed)  7 in 8000 waterbirths with documented infection  Poorly cleaned equipment most common cause  Slightly lower group B strep transmission rate  Drowning  Maternal:  Very low risk  Related to seizures or fainting  Newborn:  Very low risk. No evidence of increased risk of respiratory problems in multiple large studies  Physiological protection from breathing under water  Avoid underwater birth if there are any fetal complications  Once baby's head is out of the water, keep it out.  Birth complication  Some reports of cord trauma, but risk decreased by bringing baby to surface gradually  No evidence of increased risk of shoulder dystocia. Mothers can usually change positions faster in water than in a bed, possibly aiding the maneuvers to free the shoulder.   There are 2 things you MUST do to have a waterbirth with [facility]: Attend a waterbirth class at [facility]   3rd Wednesday of every month from 7-9 pm (virtual during COVID) [employer] at [email or web address] or [email or web address] or by calling [phone] Bring [place] the certificate from the class to your prenatal appointment or send via MyChart Meet with a midwife at 36 weeks* to see if you can still plan a waterbirth and to sign the consent.   *We also recommend that you schedule as many of your prenatal  visits with a midwife as possible.    Helpful information: You may want to bring a bathing suit top to the hospital to wear during labor but this is optional.  All other supplies are provided by the hospital. Please arrive at the hospital with signs of active labor, and do not wait at home until late in labor. It takes 45 min- 1 hour for fetal monitoring, and check in to your room to take place, plus transport and filling of the waterbirth tub.    Things that would prevent you from having a waterbirth: Premature, <37wks  Previous  cesarean birth  Presence of thick meconium-stained fluid  Multiple gestation (Twins, triplets, etc.)  Uncontrolled diabetes or gestational diabetes requiring medication  Hypertension diagnosed in pregnancy or preexisting hypertension (gestational hypertension, preeclampsia, or chronic hypertension) Fetal growth restriction (your baby measures less than 10th percentile on ultrasound) Heavy vaginal bleeding  Non-reassuring fetal heart rate  Active infection (MRSA, etc.). Group B Strep is NOT a contraindication for waterbirth.  If your labor has to be induced and induction method requires continuous monitoring of the baby's heart rate  Other risks/issues identified by your obstetrical provider   Please remember that birth is unpredictable. Under certain unforeseeable circumstances your provider may advise against giving birth in the tub. These decisions will be made on a case-by-case basis and with the safety of you and your baby as our highest priority.         Thank you for your interest in CenteringPregnancy. I wanted to give you a little more information. We offer a different way to provide prenatal care -that includes less waiting and lots more fun and education. You'll learn about things like common discomforts of pregnancy, infant care, breastfeeding, nutrition and what to expect in labor. It's called CenteringPregnancy. You will meet in a group with other  pregnant women due around the same time as you.? In Centering you will have individual time with the provider who will be in the room the whole time - so you will actually have much more time with your provider in Centering than in traditional prenatal care.? You will come directly into the Centering room and will not wait in the lobby so there is no wasted time.? You will have 2-hour visits every 4 weeks then every 2 weeks. You will know your Centering prenatal appointments in advance. In your last month of pregnancy, you may also come in for some individual visits. Additional appointments can be scheduled if you need more care. Studies have shown that CenteringPregnancy improves birth outcomes. We have seen especially big improvements in fewer Black women delivering babies who are too small or born too early.  There is a website that you can browse. It is [email or web address].org , then click on video link

## 2023-10-08 NOTE — Addendum Note (Signed)
Addended by: Brien Mates T on: 10/08/2023 11:07 AM   Modules accepted: Orders

## 2023-10-08 NOTE — Progress Notes (Signed)
New OB Intake  I connected with Erin Tran  on 10/08/23 at 10:15 AM EST by MyChart Video Visit and verified that I am speaking with the correct person using two identifiers. Nurse is located at [facility] and pt is located at home.  I discussed the limitations, risks, security and privacy concerns of performing an evaluation and management service by telephone and the availability of in person appointments. I also discussed with the patient that there may be a patient responsible charge related to this service. The patient expressed understanding and agreed to proceed.  I explained I am completing New OB Intake today. We discussed EDD of Not found.. Pt is [id number]. I reviewed her allergies, medications and Medical/Surgical/OB history.    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 Discussed the nature of our practice with multiple providers including residents and students. Due to the size of the practice, the delivering provider may not be the same as those providing prenatal care.   Patient is interested in water birth. Offered upcoming OB visit with CNM to discuss further.  MyChart/Babyscripts MyChart access verified. I explained pt will have some visits in office and some virtually. Babyscripts instructions given and order placed. Patient verifies receipt of registration text/e-mail. Account successfully created and app downloaded. If patient is a candidate for Optimized scheduling, add to sticky note.   Blood Pressure Cuff/Weight Scale Blood  pressure cuff ordered for patient to pick-up from [employer]. Explained after first prenatal appt pt will check weekly and document in Babyscripts.  Anatomy US Please schedule anatomy at new ob visit. Dating/viability [place] scheduled at NOB intake  Is patient a CenteringPregnancy candidate?  Accepted    Is patient a Mom+Baby Combined Care candidate?  Declined   If accepted, confirm patient does not intend to move from the area for at least 12 months, then notify Mom+Baby staff  Interested in [place]? If yes, send referral and doula dot phrase.   Is patient a candidate for Babyscripts Optimization? No, due to centering   First visit review I reviewed new OB appt with patient. Explained pt will be seen by Albertine Grates, FNP at first visit. Discussed Avelina Laine genetic screening with patient. Panorama and Horizon.. Routine prenatal labs is needed at new ob visit.   Last Pap Diagnosis  Date Value Ref Range Status  06/03/2017   Final   NEGATIVE FOR INTRAEPITHELIAL LESIONS OR MALIGNANCY.  06/03/2017   Final   FUNGAL ORGANISMS PRESENT CONSISTENT WITH CANDIDA SPP.    Lowry Bowl, CMA 10/08/2023  10:11 AM

## 2023-10-09 ENCOUNTER — Ambulatory Visit (HOSPITAL_COMMUNITY): Payer: Medicaid Other

## 2023-10-13 ENCOUNTER — Encounter: Payer: Self-pay | Admitting: Obstetrics and Gynecology

## 2023-10-13 ENCOUNTER — Other Ambulatory Visit: Payer: Self-pay

## 2023-10-13 ENCOUNTER — Ambulatory Visit (INDEPENDENT_AMBULATORY_CARE_PROVIDER_SITE_OTHER): Payer: Medicaid Other | Admitting: Obstetrics and Gynecology

## 2023-10-13 ENCOUNTER — Other Ambulatory Visit (HOSPITAL_COMMUNITY)
Admission: RE | Admit: 2023-10-13 | Discharge: 2023-10-13 | Disposition: A | Discharge status: Home / Self Care | Payer: Medicaid Other | Source: Ambulatory Visit | Attending: Obstetrics and Gynecology | Admitting: Obstetrics and Gynecology

## 2023-10-13 VITALS — BP 121/52 | HR 71 | Wt 186.7 lb

## 2023-10-13 DIAGNOSIS — O99332 Smoking (tobacco) complicating pregnancy, second trimester: Secondary | ICD-10-CM

## 2023-10-13 DIAGNOSIS — O99330 Smoking (tobacco) complicating pregnancy, unspecified trimester: Secondary | ICD-10-CM | POA: Insufficient documentation

## 2023-10-13 DIAGNOSIS — O0990 Supervision of high risk pregnancy, unspecified, unspecified trimester: Secondary | ICD-10-CM | POA: Insufficient documentation

## 2023-10-13 DIAGNOSIS — Z8619 Personal history of other infectious and parasitic diseases: Secondary | ICD-10-CM | POA: Insufficient documentation

## 2023-10-13 DIAGNOSIS — O26899 Other specified pregnancy related conditions, unspecified trimester: Secondary | ICD-10-CM

## 2023-10-13 DIAGNOSIS — Z3A12 12 weeks gestation of pregnancy: Secondary | ICD-10-CM | POA: Diagnosis not present

## 2023-10-13 DIAGNOSIS — Z6791 Unspecified blood type, Rh negative: Secondary | ICD-10-CM | POA: Insufficient documentation

## 2023-10-13 HISTORY — DX: Other specified pregnancy related conditions, unspecified trimester: O26.899

## 2023-10-13 MED ORDER — NICOTINE 14 MG/24HR TD PT24: 14.0000 mg | MEDICATED_PATCH | Freq: Every day | TRANSDERMAL | 0 refills | Status: DC

## 2023-10-13 NOTE — Patient Instructions (Signed)
   Considering Waterbirth? Guide for patients at Center for [facility]) Why consider waterbirth? Gentle birth for babies  Less pain medicine used in labor  May allow for passive descent/less pushing  May reduce perineal tears  More mobility and instinctive maternal position changes  Increased maternal relaxation   Is waterbirth safe? What are the risks of infection, drowning or other complications? Infection:  Very low risk (3.7 % for tub vs 4.8% for bed)  7 in 8000 waterbirths with documented infection  Poorly cleaned equipment most common cause  Slightly lower group B strep transmission rate  Drowning  Maternal:  Very low risk  Related to seizures or fainting  Newborn:  Very low risk. No evidence of increased risk of respiratory problems in multiple large studies  Physiological protection from breathing under water  Avoid underwater birth if there are any fetal complications  Once baby's head is out of the water, keep it out.  Birth complication  Some reports of cord trauma, but risk decreased by bringing baby to surface gradually  No evidence of increased risk of shoulder dystocia. Mothers can usually change positions faster in water than in a bed, possibly aiding the maneuvers to free the shoulder.   There are 2 things you MUST do to have a waterbirth with [facility]: Attend a waterbirth class at [facility] at [facility]   3rd Wednesday of every month from 7-9 pm (virtual during COVID) [employer] at [email or web address] or [email or web address] or by calling [phone] Bring [place] the certificate from the class to your prenatal appointment or send via MyChart Meet with a midwife at 36 weeks* to see if you can still plan a waterbirth and to sign the consent.   *We also recommend that you schedule as many of your prenatal visits with a midwife as possible.    Helpful information: You may want to bring a bathing suit top to the hospital  to wear during labor but this is optional.  All other supplies are provided by the hospital. Please arrive at the hospital with signs of active labor, and do not wait at home until late in labor. It takes 45 min- 1 hour for fetal monitoring, and check in to your room to take place, plus transport and filling of the waterbirth tub.    Things that would prevent you from having a waterbirth: Premature, <37wks  Previous cesarean birth  Presence of thick meconium-stained fluid  Multiple gestation (Twins, triplets, etc.)  Uncontrolled diabetes or gestational diabetes requiring medication  Hypertension diagnosed in pregnancy or preexisting hypertension (gestational hypertension, preeclampsia, or chronic hypertension) Fetal growth restriction (your baby measures less than 10th percentile on ultrasound) Heavy vaginal bleeding  Non-reassuring fetal heart rate  Active infection (MRSA, etc.). Group B Strep is NOT a contraindication for waterbirth.  If your labor has to be induced and induction method requires continuous monitoring of the baby's heart rate  Other risks/issues identified by your obstetrical provider   Please remember that birth is unpredictable. Under certain unforeseeable circumstances your provider may advise against giving birth in the tub. These decisions will be made on a case-by-case basis and with the safety of you and your baby as our highest priority.    Updated 11/28/21

## 2023-10-13 NOTE — Progress Notes (Signed)
INITIAL PRENATAL VISIT  Subjective:   Erin Tran is being seen today for her first obstetrical visit.  She is at [redacted]w[redacted]d gestation by LMP Her obstetrical history is significant for smoker ( no 7-8/ day)  Relationship with FOB: significant other, living together. Patient does intend to breast feed. Pregnancy history fully reviewed.  Patient reports no complaints.  Indications for ASA therapy (per uptodate)  Two or more of the following: Nulliparity No Obesity (body mass index >30 kg/m2) No Family history of preeclampsia in mother or sister No Age >=35 years No Sociodemographic characteristics (African American race, low socioeconomic level) Yes Personal risk factors (eg, previous pregnancy with low birth weight or small for gestational age infant, previous adverse pregnancy outcome [eg, stillbirth], interval >10 years between pregnancies) No  Review of Systems:   Review of Systems  Objective:    Obstetric History OB History  Gravida Para Term Preterm AB Living  6 4 3 1 1 4   SAB IAB Ectopic Multiple Live Births  0 0 0 0 4    # Outcome Date GA Lbr Len/2nd Weight Sex Type Anes PTL Lv  6 Current           5 Term 04/09/19 [redacted]w[redacted]d 02:24 / 00:19 6 lb 14.2 oz (3.124 kg) M Vag-Spont EPI  LIV  4 Preterm 06/10/17 [redacted]w[redacted]d / 00:19 6 lb 5.9 oz (2.89 kg) M Vag-Spont EPI  LIV  3 Term 06/29/15 [redacted]w[redacted]d / 00:45 8 lb 2.3 oz (3.695 kg) M Vag-Spont EPI  LIV  2 Term 12/08/12 [redacted]w[redacted]d 13:00 / 00:38 7 lb 9 oz (3.43 kg) M Vag-Spont EPI  LIV  1 AB 2012 [redacted]w[redacted]d    SAB       Past Medical History:  Diagnosis Date   Asthma    Asthma, intermittent, uncomplicated 10/29/2014   Overview:   No meds     Blood type, Rh negative 06/28/2015   Patient has h/o alloimmunization   -2014 pregnancy she presented to care at [redacted]w[redacted]d in 06/2012 she was anti D (1:64) Anti C (1:4) positive. She has a history of a TAB in 2012 but reported receiving rhogam.      Patient has h/o alloimmunization   -2014 pregnancy she presented  to care at [redacted]w[redacted]d in 06/2012 she was anti D (1:64) Anti C (1:4) positive. She has a history of a TAB in 2012 but reported rec   Full-term premature rupture of membranes 06/08/2017   HSV (herpes simplex virus) anogenital infection    oct. 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 08/11/2018   Morbid obesity (HCC) 01/14/2022   Other specified cough 02/06/2023   Pruritic dermatitis 01/14/2022   Supervision of high risk pregnancy, antepartum 06/27/2015   Clinic [facility] Prenatal Labs    Dating 19 wk u/s Blood type: --/--/O NEG (10/14 1243)     Genetic Screen Too late Antibody:POS (10/14 1243)    Anatomic [place] female Rubella:    GTT Third trimester:  RPR: Non Reactive (10/14 1243)     Flu vaccine 06/03/17 HBsAg: Negative (10/14 1438)     TDaP vaccine 06/03/17  Rhogam:06/03/17 HIV:       Baby Food Breast                         Vitamin D deficiency 01/14/2022    Past Surgical History:  Procedure Laterality Date   WISDOM TOOTH EXTRACTION      Current Outpatient Medications on File Prior to Visit  Medication Sig Dispense Refill   Prenatal Vit-Fe Fumarate-FA (PRENATAL MULTIVITAMIN) TABS tablet Take 1 tablet by mouth daily at 12 noon. 30 tablet 11   albuterol (PROVENTIL HFA;VENTOLIN HFA) 108 (90 Base) MCG/ACT inhaler Inhale 2 puffs into the lungs every 6 (six) hours as needed for wheezing or shortness of breath. (Patient not taking: Reported on 10/08/2023) 1 Inhaler 2   Blood Pressure Monitoring (BLOOD PRESSURE KIT) DEVI 1 each by Does not apply route as needed. (Patient not taking: Reported on 10/13/2023) 1 each 0   cephALEXin (KEFLEX) 500 MG capsule Take 1 capsule (500 mg total) by mouth 3 (three) times daily. (Patient not taking: Reported on 10/13/2023) 30 capsule 0   cetirizine (ZYRTEC ALLERGY) 10  MG tablet Take 1 tablet (10 mg total) by mouth daily. (Patient not taking: Reported on 10/13/2023) 15 tablet 0   HYDROcodone-acetaminophen (NORCO/VICODIN) 5-325 MG tablet Take 1 tablet by mouth every 4 (four) hours as needed. (Patient not taking: Reported on 10/13/2023) 10 tablet 0   ibuprofen (ADVIL) 600 MG tablet Take 1 tablet (600 mg total) by mouth every 6 (six) hours as needed. (Patient not taking: Reported on 10/13/2023) 30 tablet 0   predniSONE (STERAPRED UNI-PAK 21 TAB) 10 MG (21) TBPK tablet Take by mouth daily. Take 6 tabs by mouth daily  for 2 days, then 5 tabs for 2 days, then 4 tabs for 2 days, then 3 tabs for 2 days, 2 tabs for 2 days, then 1 tab by mouth daily for 2 days (Patient not taking: Reported on 10/13/2023) 42 tablet 0   No current facility-administered medications on file prior to visit.    No Known Allergies  Social History:  reports that she has quit smoking. Her smoking use included cigarettes. She has never used smokeless tobacco. She reports that she does not drink alcohol and does not use drugs.  Family History  Problem Relation Age of Onset   Seizures Mother    Diabetes Maternal Grandmother    Cancer Neg Hx    Hypertension Neg Hx    Hyperlipidemia Neg Hx    Stroke Neg Hx     The following portions of the patient's history were reviewed and updated as appropriate: allergies, current medications, past family history, past medical history, past social history, past surgical history and problem list.  Review of Systems Review of Systems    Physical Exam:  BP (!) 121/52   Pulse 71   Wt 186 lb 11.7 oz (84.7 kg)   LMP 07/19/2023 (Approximate)   BMI 30.14 kg/m  CONSTITUTIONAL: Well-developed, well-nourished female in no acute distress.  HENT:  Normocephalic, atraumatic.  EYES: Conjunctivae normal.  NECK: Normal range of motion SKIN: Skin is warm and dry.  MUSCULOSKELETAL: Normal range of motion. NEUROLOGIC: Alert and oriented PSYCHIATRIC: Normal mood and  affect. Normal behavior. Normal judgment and thought content. CARDIOVASCULAR: Normal heart rate  RESPIRATORY: normal effort ABDOMEN: Soft PELVIC: Normal appearing external genitalia; normal appearing vaginal mucosa and cervix.  No abnormal discharge noted.  Pap smear obtained.  Normal uterine size, no other palpable masses, no uterine or adnexal  tenderness.  Fetal Heart Rate (bpm): 161   Movement: Absent       Assessment:    Pregnancy: [id number]  1. Supervision of high risk pregnancy, antepartum (Primary) BP and FHR normal Doing well  - Culture, OB Urine - CBC/D/Plt+RPR+Rh+ABO+RubIgG... - Hemoglobin A1c - PANORAMA PRENATAL TEST - HORIZON Basic Panel - [place] MFM OB DETAIL +14 WK; Future - Cytology - PAP( [place]) - Ambulatory Referral to Doula Services - TSH Rfx on Abnormal to Free T4  2. Rh negative state in antepartum period Rhogam at 28 weeks  3. Tobacco smoking affecting pregnancy in second trimester Smokes 7-8 cigarettes a day, discussed cessation with nicotine patch, other options such as chewing gum, fidget spinner - nicotine (NICODERM CQ - DOSED IN MG/24 HOURS) 14 mg/24hr patch; Place 1 patch (14 mg total) onto the skin daily.  Dispense: 28 patch; Refill: 0  4. 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 Prenatal vitamins. Problem list reviewed and updated. Reviewed in detail the nature of the practice with collaborative care between  Genetic screening discussed: NIPS/First trimester screen/Quad/AFP ordered. Role of ultrasound in pregnancy discussed; Anatomy US: ordered.  Follow up in 4 weeks. Weight gain recommendations per IOM guidelines reviewed: underweight/BMI 18.5 or less > 28 - 40 lbs; normal weight/BMI 18.5 - 24.9 > 25 - 35 lbs; overweight/BMI 25 - 29.9 > 15 -  25 lbs; obese/BMI  30 or more > 11 - 20 lbs.  Discussed clinic routines, schedule of care and testing, genetic screening options, involvement of students and residents under the direct supervision of APPs and doctors and presence of female providers. Pt verbalized understanding.   Future Appointments  Date Time Provider Department Center  11/05/2023  9:00 AM CENTERING PROVIDER [facility]  12/01/2023  8:15 AM [facility] NURSE [facility]  12/01/2023  8:30 AM [facility] US5 [facility]  12/03/2023  9:00 AM CENTERING PROVIDER [facility]  12/31/2023  9:00 AM CENTERING PROVIDER [facility]  01/14/2024  9:00 AM CENTERING PROVIDER [facility]  01/28/2024  9:00 AM CENTERING PROVIDER [facility]  02/11/2024  9:00 AM CENTERING PROVIDER [facility]  02/25/2024  9:00 AM CENTERING PROVIDER [facility]  03/10/2024  9:00 AM CENTERING PROVIDER [facility]  03/24/2024  9:00 AM CENTERING PROVIDER [facility]  04/07/2024  9:00 AM CENTERING PROVIDER [facility]     Sue Lush, FNP

## 2023-10-14 LAB — TSH RFX ON ABNORMAL TO FREE T4: TSH: 2.3 u[IU]/mL (ref 0.450–4.500)

## 2023-10-15 LAB — CYTOLOGY - PAP
Chlamydia: NEGATIVE
Comment: NEGATIVE
Comment: NORMAL
Diagnosis: NEGATIVE
Neisseria Gonorrhea: NEGATIVE

## 2023-10-15 LAB — CULTURE, OB URINE

## 2023-10-15 LAB — URINE CULTURE, OB REFLEX

## 2023-10-22 LAB — PANORAMA PRENATAL TEST FULL PANEL:PANORAMA TEST PLUS 5 ADDITIONAL MICRODELETIONS: FETAL FRACTION: 10.2

## 2023-10-23 LAB — HORIZON CUSTOM: REPORT SUMMARY: NEGATIVE

## 2023-10-27 ENCOUNTER — Encounter: Payer: Self-pay | Admitting: *Deleted

## 2023-10-27 LAB — CBC/D/PLT+RPR+RH+ABO+RUBIGG...
Basophils Absolute: 0.1 10*3/uL (ref 0.0–0.2)
Basos: 1 %
EOS (ABSOLUTE): 0.1 10*3/uL (ref 0.0–0.4)
Eos: 1 %
HCV Ab: NONREACTIVE
HIV Screen 4th Generation wRfx: NONREACTIVE
Hematocrit: 35.3 % (ref 34.0–46.6)
Hemoglobin: 11.5 g/dL (ref 11.1–15.9)
Hepatitis B Surface Ag: NEGATIVE
Immature Grans (Abs): 0 10*3/uL (ref 0.0–0.1)
Immature Granulocytes: 0 %
Lymphocytes Absolute: 2.1 10*3/uL (ref 0.7–3.1)
Lymphs: 23 %
MCH: 31.3 pg (ref 26.6–33.0)
MCHC: 32.6 g/dL (ref 31.5–35.7)
MCV: 96 fL (ref 79–97)
Monocytes Absolute: 0.6 10*3/uL (ref 0.1–0.9)
Monocytes: 6 %
Neutrophils Absolute: 6.3 10*3/uL (ref 1.4–7.0)
Neutrophils: 69 %
Platelets: 319 10*3/uL (ref 150–450)
RBC: 3.68 x10E6/uL — ABNORMAL LOW (ref 3.77–5.28)
RDW: 12.2 % (ref 11.7–15.4)
RPR Ser Ql: NONREACTIVE
Rh Factor: NEGATIVE
Rubella Antibodies, IGG: 1.16 {index} (ref >0.99)
WBC: 9.2 10*3/uL (ref 3.4–10.8)

## 2023-10-27 LAB — AB SCR+ANTIBODY ID
Antibody Screen: POSITIVE — AB
Coombs Titer #1: 16

## 2023-10-27 LAB — HEMOGLOBIN A1C
Est. average glucose Bld gHb Est-mCnc: 103 mg/dL
Hgb A1c MFr Bld: 5.2 % (ref 4.8–5.6)

## 2023-10-27 LAB — HCV INTERPRETATION

## 2023-11-05 ENCOUNTER — Ambulatory Visit: Payer: Medicaid Other | Admitting: Family Medicine

## 2023-11-05 ENCOUNTER — Other Ambulatory Visit: Payer: Self-pay

## 2023-11-05 VITALS — BP 126/76 | HR 61 | Wt 194.0 lb

## 2023-11-05 DIAGNOSIS — O0990 Supervision of high risk pregnancy, unspecified, unspecified trimester: Secondary | ICD-10-CM

## 2023-11-05 DIAGNOSIS — Z6791 Unspecified blood type, Rh negative: Secondary | ICD-10-CM

## 2023-11-05 DIAGNOSIS — O0992 Supervision of high risk pregnancy, unspecified, second trimester: Secondary | ICD-10-CM | POA: Diagnosis not present

## 2023-11-05 DIAGNOSIS — O26899 Other specified pregnancy related conditions, unspecified trimester: Secondary | ICD-10-CM

## 2023-11-05 DIAGNOSIS — O99332 Smoking (tobacco) complicating pregnancy, second trimester: Secondary | ICD-10-CM | POA: Diagnosis not present

## 2023-11-05 DIAGNOSIS — Z8619 Personal history of other infectious and parasitic diseases: Secondary | ICD-10-CM

## 2023-11-05 DIAGNOSIS — O26892 Other specified pregnancy related conditions, second trimester: Secondary | ICD-10-CM | POA: Diagnosis not present

## 2023-11-05 DIAGNOSIS — Z3A15 15 weeks gestation of pregnancy: Secondary | ICD-10-CM

## 2023-11-05 NOTE — Progress Notes (Signed)
   PRENATAL VISIT NOTE  Subjective:  Erin Tran is a 28 y.o. U9W1191 at [redacted]w[redacted]d being seen today for ongoing prenatal care.  She is currently monitored for the following issues for this high-risk pregnancy and has Supervision of high risk pregnancy, antepartum; Tobacco smoking affecting pregnancy; Rh negative state in antepartum period; and History of PCR DNA positive for HSV2 on their problem list.  Patient reports no complaints.  Contractions: Not present.  .  Movement: Present. Denies leaking of fluid.   The following portions of the patient's history were reviewed and updated as appropriate: allergies, current medications, past family history, past medical history, past social history, past surgical history and problem list.   Objective:   Vitals:   11/05/23 1113  BP: 126/76  Pulse: 61  Weight: 194 lb (88 kg)    Fetal Status: Fetal Heart Rate (bpm): 156   Movement: Present     General:  Alert, oriented and cooperative. Patient is in no acute distress.  Skin: Skin is warm and dry. No rash noted.   Cardiovascular: Normal heart rate noted  Respiratory: Normal respiratory effort, no problems with respiration noted  Abdomen: Soft, gravid, appropriate for gestational age.  Pain/Pressure: Absent     Pelvic: Cervical exam deferred        Extremities: Normal range of motion.     Mental Status: Normal mood and affect. Normal behavior. Normal judgment and thought content.   Assessment and Plan:  Pregnancy: [id number] at [redacted]w[redacted]d 1. Supervision of high risk pregnancy, antepartum (Primary)   Centering Pregnancy, Session#1: Introduction to model of care. Group determined rules for self-governance and closing phrase. Oriented group to space and mother's notebook.   Facilitated discussion today:  common discomforts, medications safe in pregnancy, nutrition, pregnancy safe foods  Fundal height and FHR appropriate today unless noted otherwise in plan of care. Patient to continue group care.     2. History of PCR DNA positive for HSV2 Valtrex at 34-36 wks  3. Rh negative state in antepartum period Rhogam w/u PP   4. Tobacco smoking affecting pregnancy in second trimester Discuss/recommend cessation/reduction at f/u  Preterm labor symptoms and general obstetric precautions including but not limited to vaginal bleeding, contractions, leaking of fluid and fetal movement were reviewed in detail with the patient. Please refer to After Visit Summary for other counseling recommendations.   No follow-ups on file.  Future Appointments  Date Time Provider Department Center  12/01/2023  8:15 AM [facility] NURSE [facility]  12/01/2023  8:30 AM [facility] US5 [facility]  12/01/2023  9:15 AM [facility] PROVIDER 1 [facility]  12/03/2023  9:00 AM CENTERING PROVIDER [facility]  12/31/2023  9:00 AM CENTERING PROVIDER [facility]  01/14/2024  9:00 AM CENTERING PROVIDER [facility]  01/28/2024  9:00 AM CENTERING PROVIDER [facility]  02/11/2024  9:00 AM CENTERING PROVIDER [facility]  02/25/2024  9:00 AM CENTERING PROVIDER [facility]  03/10/2024  9:00 AM CENTERING PROVIDER [facility]  03/24/2024  9:00 AM CENTERING PROVIDER [facility]  04/07/2024  9:00 AM CENTERING PROVIDER [facility]    Wyn Forster, MD

## 2023-11-27 ENCOUNTER — Ambulatory Visit: Attending: Obstetrics and Gynecology | Admitting: *Deleted

## 2023-11-27 DIAGNOSIS — Z3A19 19 weeks gestation of pregnancy: Secondary | ICD-10-CM

## 2023-11-27 DIAGNOSIS — O0992 Supervision of high risk pregnancy, unspecified, second trimester: Secondary | ICD-10-CM

## 2023-11-27 DIAGNOSIS — O0990 Supervision of high risk pregnancy, unspecified, unspecified trimester: Secondary | ICD-10-CM

## 2023-11-27 NOTE — Progress Notes (Signed)
 New OB Intake  I connected with Erin Tran  on 11/27/23 at 1100 by phone and verified that I am speaking with the correct person using two identifiers. Nurse is located at [facility] and pt is located at home.   I explained I am completing New Patient Intake today. We discussed EDD of 04/24/2024, by Last Menstrual Period. Pt is [id number]. I reviewed her allergies, medications and Medical/Surgical/OB history.    Patient Active Problem List   Diagnosis Date Noted   Tobacco smoking affecting pregnancy 10/13/2023   Rh negative state in antepartum period 10/13/2023   History of PCR DNA positive for HSV2 10/13/2023   Supervision of high risk pregnancy, antepartum 10/08/2023    Patient advised of first appointment in our office and when to arrive.   All questions were answered.

## 2023-12-01 ENCOUNTER — Other Ambulatory Visit: Payer: Self-pay

## 2023-12-01 ENCOUNTER — Ambulatory Visit: Payer: Medicaid Other

## 2023-12-01 ENCOUNTER — Ambulatory Visit: Payer: Medicaid Other | Attending: Obstetrics and Gynecology

## 2023-12-01 ENCOUNTER — Ambulatory Visit

## 2023-12-01 ENCOUNTER — Ambulatory Visit (HOSPITAL_BASED_OUTPATIENT_CLINIC_OR_DEPARTMENT_OTHER): Payer: Medicaid Other | Admitting: Obstetrics and Gynecology

## 2023-12-01 ENCOUNTER — Other Ambulatory Visit: Payer: Self-pay | Admitting: *Deleted

## 2023-12-01 ENCOUNTER — Other Ambulatory Visit: Payer: Self-pay | Admitting: Obstetrics and Gynecology

## 2023-12-01 VITALS — BP 124/66 | HR 89

## 2023-12-01 DIAGNOSIS — F17210 Nicotine dependence, cigarettes, uncomplicated: Secondary | ICD-10-CM | POA: Diagnosis not present

## 2023-12-01 DIAGNOSIS — O360120 Maternal care for anti-D [Rh] antibodies, second trimester, not applicable or unspecified: Secondary | ICD-10-CM

## 2023-12-01 DIAGNOSIS — Z3A19 19 weeks gestation of pregnancy: Secondary | ICD-10-CM | POA: Insufficient documentation

## 2023-12-01 DIAGNOSIS — O280 Abnormal hematological finding on antenatal screening of mother: Secondary | ICD-10-CM | POA: Diagnosis present

## 2023-12-01 DIAGNOSIS — O0990 Supervision of high risk pregnancy, unspecified, unspecified trimester: Secondary | ICD-10-CM

## 2023-12-01 DIAGNOSIS — Z3689 Encounter for other specified antenatal screening: Secondary | ICD-10-CM | POA: Insufficient documentation

## 2023-12-01 DIAGNOSIS — O99332 Smoking (tobacco) complicating pregnancy, second trimester: Secondary | ICD-10-CM | POA: Diagnosis not present

## 2023-12-01 DIAGNOSIS — O360920 Maternal care for other rhesus isoimmunization, second trimester, not applicable or unspecified: Secondary | ICD-10-CM | POA: Diagnosis present

## 2023-12-01 DIAGNOSIS — O361920 Maternal care for other isoimmunization, second trimester, not applicable or unspecified: Secondary | ICD-10-CM | POA: Diagnosis not present

## 2023-12-01 DIAGNOSIS — B009 Herpesviral infection, unspecified: Secondary | ICD-10-CM

## 2023-12-01 DIAGNOSIS — O09212 Supervision of pregnancy with history of pre-term labor, second trimester: Secondary | ICD-10-CM | POA: Diagnosis not present

## 2023-12-01 DIAGNOSIS — O98512 Other viral diseases complicating pregnancy, second trimester: Secondary | ICD-10-CM

## 2023-12-01 NOTE — Progress Notes (Signed)
 Maternal-Fetal Medicine Consultation Name: Erin Tran MRN: [id number]  [id number] at 19w 3d gestation.  Patient is here for fetal anatomy scan. On cell-free fetal DNA screening, the risks of fetal aneuploidies are not increased. Patient's blood type is O negative and recent antibody screening assessed on 10/13/2023 showed increased anti-D titers (1:16) and increased anti-C (big C) antibodies (too weak to titer). Patient does not give history of vaginal bleeding in this pregnancy.  She did not receive RhoGAM.  Obstetric history is significant for 3 term vaginal deliveries and 1 preterm (36 weeks) delivery.  Her first 2 children had jaundice at birth.  All her children are in good health.  In her last pregnancy in 2020, anti-D titer was 1 :256 but no hemolytic disease of newborn was seen.  Patient was delivered at [redacted] weeks gestation.  This pregnancy is from a new partner. Patient does not report any chronic medical conditions including diabetes or hypertension.  Blood pressure today at our office is 120/66 mmHg.  Ultrasound We performed fetal anatomical survey.  Amniotic fluid is normal and good fetal activity seen.  Fetal biometry is consistent with the previously established dates.  No markers of aneuploidies or obvious fetal structural defects are seen.  No evidence of fetal hydrops. Middle cerebral artery Doppler showed normal peak systolic velocity measurements (no evidence of fetal anemia).  Rhesus Isoimmunization I counseled the couple on the significance of increased anti-D titers.  Anti-D titer of 1: 16 is considered critical (some institution consider 1: 32 critical titer).  Increased antibodies are associated with an increased risk for fetal hemolysis.  I explained the mechanism pathophysiology of anti-D antibodies targeting fetal red blood cells if fetus is rhesus positive. In addition, she has weak anti-C antibodies.  Anti-C antibodies can also cause fetal anemia. I discussed the  genetics.  Her partner reports his blood type is A positive.  If he is homozygous positive, we expect the fetus to be rhesus positive.  If, however, he is heterozygous, there is a 50% chance that the fetus will be Rh negative.  I discussed prenatal diagnosis and the option of amniocentesis or cell free fetal DNA screening (UNITY).  I discussed amniocentesis procedure on the possible complication of miscarriage (1 and 5 and procedures).  Cell free fetal DNA screening has a greater detection rate for Rh type of the fetus. I discussed the significance of the cerebral artery Doppler study in diagnosing fetal anemia.  If MCA Doppler studies show increased peak systolic velocity (consistent with fetal anemia), fetal blood sampling and fetal transfusion will be advised. I discussed the frequency of MCA Doppler studies.  After counseling, patient opted to have cell free fetal DNA screening, blood was drawn at our office for cell free fetal DNA screening (for fetal antigens D and C, and antibody screening).  Cigarette smoking I counseled the patient that smoking increases the risk of fetal growth restriction and placental abruption.  Smoking cessation at any gestational age is beneficial and should routinely be addressed in her prenatal visits. Nicotine-replacement therapy (NRT) can be considered if nonpharmacological approaches have failed. Its chief benefit is that it does not contain other toxic substances present in cigarettes. Cigarettes, in addition to nicotine and Carbon monoxide, contain several other toxic substances. Patient will consider nicotine-replacement therapy. Recommendations -MCA Doppler in 2 weeks and more frequently as needed (depending on the trend). -Blood drawn for UNITY screening to determine RhD and RhC types today. -Sample sent to [facility] for antibody screening. -Smoking cessation  and nicotine replacement therapy counseling at your office.  Consultation including face-to-face (more  than 50%) counseling 45 minutes.

## 2023-12-03 ENCOUNTER — Ambulatory Visit: Payer: Self-pay | Admitting: Family Medicine

## 2023-12-03 VITALS — BP 113/66 | HR 84 | Wt 196.6 lb

## 2023-12-03 DIAGNOSIS — Z6791 Unspecified blood type, Rh negative: Secondary | ICD-10-CM

## 2023-12-03 DIAGNOSIS — Z3182 Encounter for Rh incompatibility status: Secondary | ICD-10-CM | POA: Insufficient documentation

## 2023-12-03 DIAGNOSIS — O26892 Other specified pregnancy related conditions, second trimester: Secondary | ICD-10-CM

## 2023-12-03 DIAGNOSIS — O0992 Supervision of high risk pregnancy, unspecified, second trimester: Secondary | ICD-10-CM | POA: Diagnosis not present

## 2023-12-03 DIAGNOSIS — Z8619 Personal history of other infectious and parasitic diseases: Secondary | ICD-10-CM

## 2023-12-03 DIAGNOSIS — O0990 Supervision of high risk pregnancy, unspecified, unspecified trimester: Secondary | ICD-10-CM

## 2023-12-03 DIAGNOSIS — O99332 Smoking (tobacco) complicating pregnancy, second trimester: Secondary | ICD-10-CM

## 2023-12-03 DIAGNOSIS — Z3A19 19 weeks gestation of pregnancy: Secondary | ICD-10-CM | POA: Diagnosis not present

## 2023-12-03 LAB — AB SCR+ANTIBODY ID
Antibody Screen: POSITIVE — AB
Coombs Titer #1: 32
Coombs Titer #2: 2

## 2023-12-03 LAB — ANTIBODY SCREEN

## 2023-12-03 NOTE — Patient Instructions (Signed)
   Considering Waterbirth? Guide for patients at Center for [facility]) Why consider waterbirth? Gentle birth for babies  Less pain medicine used in labor  May allow for passive descent/less pushing  May reduce perineal tears  More mobility and instinctive maternal position changes  Increased maternal relaxation   Is waterbirth safe? What are the risks of infection, drowning or other complications? Infection:  Very low risk (3.7 % for tub vs 4.8% for bed)  7 in 8000 waterbirths with documented infection  Poorly cleaned equipment most common cause  Slightly lower group B strep transmission rate  Drowning  Maternal:  Very low risk  Related to seizures or fainting  Newborn:  Very low risk. No evidence of increased risk of respiratory problems in multiple large studies  Physiological protection from breathing under water  Avoid underwater birth if there are any fetal complications  Once baby's head is out of the water, keep it out.  Birth complication  Some reports of cord trauma, but risk decreased by bringing baby to surface gradually  No evidence of increased risk of shoulder dystocia. Mothers can usually change positions faster in water than in a bed, possibly aiding the maneuvers to free the shoulder.   There are 2 things you MUST do to have a waterbirth with [facility]: Attend a waterbirth class at [facility] at [facility]   3rd Wednesday of every month from 7-9 pm (virtual during COVID) [employer] at [email or web address] or [email or web address] or by calling [phone] Bring [place] the certificate from the class to your prenatal appointment or send via MyChart Meet with a midwife at 36 weeks* to see if you can still plan a waterbirth and to sign the consent.   *We also recommend that you schedule as many of your prenatal visits with a midwife as possible.    Helpful information: You may want to bring a bathing suit top to the hospital  to wear during labor but this is optional.  All other supplies are provided by the hospital. Please arrive at the hospital with signs of active labor, and do not wait at home until late in labor. It takes 45 min- 1 hour for fetal monitoring, and check in to your room to take place, plus transport and filling of the waterbirth tub.    Things that would prevent you from having a waterbirth: Premature, <37wks  Previous cesarean birth  Presence of thick meconium-stained fluid  Multiple gestation (Twins, triplets, etc.)  Uncontrolled diabetes or gestational diabetes requiring medication  Hypertension diagnosed in pregnancy or preexisting hypertension (gestational hypertension, preeclampsia, or chronic hypertension) Fetal growth restriction (your baby measures less than 10th percentile on ultrasound) Heavy vaginal bleeding  Non-reassuring fetal heart rate  Active infection (MRSA, etc.). Group B Strep is NOT a contraindication for waterbirth.  If your labor has to be induced and induction method requires continuous monitoring of the baby's heart rate  Other risks/issues identified by your obstetrical provider   Please remember that birth is unpredictable. Under certain unforeseeable circumstances your provider may advise against giving birth in the tub. These decisions will be made on a case-by-case basis and with the safety of you and your baby as our highest priority.    Updated 11/28/21

## 2023-12-03 NOTE — Progress Notes (Signed)
 PRENATAL VISIT NOTE Centering Pregnancy Group 19, Session 2  Subjective:  Erin Tran is a 28 y.o. [id number] at [redacted]w[redacted]d being seen today for ongoing prenatal care.  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  Patient reports no complaints.  Contractions: Not present. Vag. Bleeding: None.  Movement: Present. Denies leaking of fluid.   The following portions of the patient's history were reviewed and updated as appropriate: allergies, current medications, past family history, past medical history, past social history, past surgical history and problem list.   Objective:   Vitals:   12/03/23 0913  BP: 113/66  Pulse: 84  Weight: 196 lb 9.6 oz (89.2 kg)    Fetal Status: Fetal Heart Rate (bpm): 152 Fundal Height: 19 cm Movement: Present     General:  Alert, oriented and cooperative. Patient is in no acute distress.  Skin: Skin is warm and dry. No rash noted.   Cardiovascular: Normal heart rate noted  Respiratory: Normal respiratory effort, no problems with respiration noted  Abdomen: Soft, gravid, appropriate for gestational age.  Pain/Pressure: Absent     Pelvic: Cervical exam deferred        Extremities: Normal range of motion.     Mental Status: Normal mood and affect. Normal behavior. Normal judgment and thought content.   Assessment and Plan:  Pregnancy: [id number] at [redacted]w[redacted]d 1. [redacted] weeks gestation of pregnancy (Primary) 2. Supervision of high risk pregnancy, antepartum  Centering Pregnancy, Session#2: Reviewed rules for self-governance with group. Direct group to [employer].   Facilitated discussion today:  Nutrition, Back pain  Mindfulness activity completed as well as deep breathing.  Also  participated in mindful eating activity. Activity-demonstrated exercises to alleviate back pain.  Fundal height and FHR appropriate today unless noted otherwise in plan. Patient to continue group care.    3. History of PCR DNA positive for HSV2 Suppression at 34-36 weeks with Valtrex  4. Rh negative state in antepartum period 5. 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  last pregnancy in 2020, anti-D titer was 1 :256 but no hemolytic disease of newborn - new partner this pregnancy Current partner reports his blood type is A positive. If he is homozygous positive, we expect the fetus to be rhesus positive. If, however, he is heterozygous, there is a 50% chance that the fetus will be Rh negative.   6. Tobacco smoking affecting pregnancy in second trimester Encouraged discontinuation  Preterm labor symptoms and general obstetric precautions including but not limited to vaginal bleeding, contractions, leaking of fluid and fetal movement were reviewed in detail with the patient. Please refer to After Visit Summary for other counseling recommendations.   Return in about 4 weeks (around 12/31/2023) for Centering, prenatal care.  Future Appointments  Date Time Provider Department Center  12/29/2023  1:00 PM [facility] PROVIDER 1 [facility]  12/29/2023  1:30 PM [facility] US4 [facility]  12/31/2023  9:00 AM CENTERING PROVIDER [facility]  01/12/2024 10:00 AM [facility] PROVIDER 1 [facility]  01/12/2024 10:30 AM [facility] US4 [facility]  01/14/2024  9:00 AM CENTERING PROVIDER [facility]  01/26/2024 10:00 AM [facility] PROVIDER 1  [facility]  01/26/2024 10:30 AM [facility] US2 [facility]  01/28/2024  9:00 AM CENTERING PROVIDER [facility]  02/11/2024  9:00 AM CENTERING PROVIDER [facility]  02/25/2024  9:00 AM CENTERING PROVIDER [facility]  03/10/2024  9:00  AM CENTERING PROVIDER [facility]  03/24/2024  9:00 AM CENTERING PROVIDER [facility]  04/07/2024  9:00 AM CENTERING PROVIDER [facility]    Wyn Forster, MD

## 2023-12-09 ENCOUNTER — Telehealth: Payer: Self-pay

## 2023-12-09 NOTE — Telephone Encounter (Signed)
 I called the patient to return her UNITY fetal antigen NIPS results. Big C antigen and D antigen were not detected in the sample. There is a very low chance of the fetus developing hemolytic disease of the newborn. Fetal surveillance will depend on the MFM's discretion.  Erin Kindle, MS, [facility] Certified Genetic Counselor [facility] for Maternal Fetal Care [phone]

## 2023-12-10 ENCOUNTER — Ambulatory Visit: Payer: Self-pay

## 2023-12-15 ENCOUNTER — Encounter: Payer: Self-pay | Admitting: *Deleted

## 2023-12-29 ENCOUNTER — Ambulatory Visit: Attending: Obstetrics and Gynecology | Admitting: Obstetrics

## 2023-12-29 ENCOUNTER — Ambulatory Visit (HOSPITAL_BASED_OUTPATIENT_CLINIC_OR_DEPARTMENT_OTHER)

## 2023-12-29 VITALS — BP 123/59 | HR 78

## 2023-12-29 DIAGNOSIS — O09293 Supervision of pregnancy with other poor reproductive or obstetric history, third trimester: Secondary | ICD-10-CM | POA: Diagnosis not present

## 2023-12-29 DIAGNOSIS — O361120 Maternal care for Anti-A sensitization, second trimester, not applicable or unspecified: Secondary | ICD-10-CM

## 2023-12-29 DIAGNOSIS — O99332 Smoking (tobacco) complicating pregnancy, second trimester: Secondary | ICD-10-CM

## 2023-12-29 DIAGNOSIS — O321XX0 Maternal care for breech presentation, not applicable or unspecified: Secondary | ICD-10-CM | POA: Insufficient documentation

## 2023-12-29 DIAGNOSIS — F17200 Nicotine dependence, unspecified, uncomplicated: Secondary | ICD-10-CM | POA: Insufficient documentation

## 2023-12-29 DIAGNOSIS — Z3A23 23 weeks gestation of pregnancy: Secondary | ICD-10-CM | POA: Diagnosis not present

## 2023-12-29 DIAGNOSIS — F17210 Nicotine dependence, cigarettes, uncomplicated: Secondary | ICD-10-CM

## 2023-12-29 DIAGNOSIS — O360120 Maternal care for anti-D [Rh] antibodies, second trimester, not applicable or unspecified: Secondary | ICD-10-CM | POA: Diagnosis not present

## 2023-12-29 DIAGNOSIS — O98513 Other viral diseases complicating pregnancy, third trimester: Secondary | ICD-10-CM | POA: Insufficient documentation

## 2023-12-29 DIAGNOSIS — O09212 Supervision of pregnancy with history of pre-term labor, second trimester: Secondary | ICD-10-CM

## 2023-12-29 DIAGNOSIS — B009 Herpesviral infection, unspecified: Secondary | ICD-10-CM

## 2023-12-29 DIAGNOSIS — O0990 Supervision of high risk pregnancy, unspecified, unspecified trimester: Secondary | ICD-10-CM | POA: Diagnosis not present

## 2023-12-29 DIAGNOSIS — O98512 Other viral diseases complicating pregnancy, second trimester: Secondary | ICD-10-CM

## 2023-12-29 NOTE — Progress Notes (Signed)
 MFM Consult Note  Erin Tran is currently at 23 weeks and 2 days.  She has been followed due to isoimmunization with anti-D and anti-C antibodies detected earlier in her pregnancy.  Her anti-D antibody titer level 4 weeks ago was 1:32 and the anti-C antibody titer level was 1:2.  She had the Unity cell free DNA test that predicts that the fetus does not carry the C or the D antigens and therefore should not be at risk for anemia.  On today's exam, the overall EFW of 1 pound 5 ounces measures at the 52nd percentile for her gestational age.    There was normal amniotic fluid noted today.  The peak systolic velocity of the middle cerebral artery was less than 1.5 multiple of the median for her gestational age, indicating that her fetus is not anemic at this time.   There were no signs of fetal hydrops noted on today's exam. The patient was reassured by today's findings.   The implications and management of isoimmunization was discussed with the patient today.    We will continue to follow her with monthly exams to assess the MCA Doppler studies and to assess for signs of fetal hydrops.  She will return in 4 weeks for another ultrasound exam.    The patient stated that all of her questions were answered today.  A total of 20 minutes was spent counseling and coordinating the care for this patient.  Greater than 50% of the time was spent in direct face-to-face contact.

## 2023-12-31 ENCOUNTER — Ambulatory Visit: Payer: Self-pay | Admitting: Obstetrics and Gynecology

## 2023-12-31 ENCOUNTER — Ambulatory Visit: Payer: Medicaid Other

## 2023-12-31 ENCOUNTER — Other Ambulatory Visit: Payer: Self-pay

## 2023-12-31 VITALS — BP 103/65 | HR 90 | Wt 203.4 lb

## 2023-12-31 DIAGNOSIS — O0992 Supervision of high risk pregnancy, unspecified, second trimester: Secondary | ICD-10-CM

## 2023-12-31 DIAGNOSIS — Z3182 Encounter for Rh incompatibility status: Secondary | ICD-10-CM

## 2023-12-31 DIAGNOSIS — O26899 Other specified pregnancy related conditions, unspecified trimester: Secondary | ICD-10-CM

## 2023-12-31 DIAGNOSIS — O0990 Supervision of high risk pregnancy, unspecified, unspecified trimester: Secondary | ICD-10-CM

## 2023-12-31 DIAGNOSIS — Z6791 Unspecified blood type, Rh negative: Secondary | ICD-10-CM

## 2023-12-31 DIAGNOSIS — O99332 Smoking (tobacco) complicating pregnancy, second trimester: Secondary | ICD-10-CM

## 2023-12-31 DIAGNOSIS — O26892 Other specified pregnancy related conditions, second trimester: Secondary | ICD-10-CM | POA: Diagnosis not present

## 2023-12-31 DIAGNOSIS — Z3A23 23 weeks gestation of pregnancy: Secondary | ICD-10-CM

## 2023-12-31 DIAGNOSIS — Z8619 Personal history of other infectious and parasitic diseases: Secondary | ICD-10-CM

## 2023-12-31 NOTE — Progress Notes (Signed)
 PRENATAL VISIT NOTE Centering Pregnancy Group 19, Session 3  Subjective:  Erin Tran is a 28 y.o. [phone] at [redacted]w[redacted]d being seen today for ongoing prenatal care.  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  Patient reports no complaints.  Contractions: Not present.  .  Movement: Present. Denies leaking of fluid.   The following portions of the patient's history were reviewed and updated as appropriate: allergies, current medications, past family history, past medical history, past social history, past surgical history and problem list.   Objective:   Vitals:   12/31/23 0908  BP: 103/65  Pulse: 90  Weight: 203 lb 6.4 oz (92.3 kg)    Fetal Status: Fetal Heart Rate (bpm): 145 Fundal Height: 23 cm Movement: Present     General:  Alert, oriented and cooperative. Patient is in no acute distress.  Skin: Skin is warm and dry. No rash noted.   Cardiovascular: Normal heart rate noted  Respiratory: Normal respiratory effort, no problems with respiration noted  Abdomen: Soft, gravid, appropriate for gestational age.  Pain/Pressure: Absent     Pelvic: Cervical exam deferred        Extremities: Normal range of motion.     Mental Status: Normal mood and affect. Normal behavior. Normal judgment and thought content.   Assessment and Plan:  Pregnancy: [id number] at [redacted]w[redacted]d 1. Supervision of high risk pregnancy, antepartum (Primary)  Centering Pregnancy, Session#3: Reviewed resources in [employer].   Facilitated discussion today:  Stress/Stress reduction and back pain and exercise in pregnancy Mindfulness activity completed as well as deep breathing with still touch for childbirth preparation.    Fundal height and  FHR appropriate today unless noted otherwise in plan. Patient to continue group care.    OGTT, Rhogam and 3rd trimester labs to be scheduled at check out for 28 weeks  2. Rh negative state in antepartum period 3. 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  Her anti-D antibody titer level 4 weeks ago was 1:32 and the anti-C antibody titer level was 1:2. - Unity cell free DNA test that predicts that the fetus does not carry the C or the D antigens and therefore should not be at risk for anemia. - 5/5 US  peak systolic velocity of the middle cerebral artery was less than 1.5 multiple of the median for her gestational age, indicating that her fetus is not anemic at that time.  - continue monthly exams to assess the MCA Doppler studies and to assess for signs of fetal hydrops.   4. History of PCR DNA positive for HSV2 Suppression at 34-36 weeks with Valtrex   5. Tobacco smoking affecting pregnancy in second trimester Encouraged discontinuation   Preterm labor symptoms and general obstetric precautions including but not limited to vaginal bleeding, contractions, leaking of fluid and fetal movement were reviewed in detail with the patient. Please refer to After Visit Summary for other counseling recommendations.   Return in about 4 weeks (around 01/28/2024) for Centering, prenatal care; Lab appointment for OGTT, 3rd trimester labs and Rhogam.  Future Appointments  Date Time Provider Department Center  01/12/2024 10:00 AM [facility] PROVIDER 1 [facility]  01/12/2024 10:30 AM [facility] US4 [facility]  01/14/2024  9:00 AM CENTERING PROVIDER [facility]  01/26/2024 10:00 AM [facility] PROVIDER 1 [facility]  01/26/2024 10:30 AM [facility] US2 [facility]  01/28/2024  9:00 AM CENTERING PROVIDER [facility]  02/11/2024  9:00 AM CENTERING PROVIDER [facility]  02/25/2024  9:00 AM CENTERING PROVIDER [facility]  03/10/2024  9:00 AM  CENTERING PROVIDER [facility]  03/24/2024  9:00 AM CENTERING PROVIDER [facility]  04/07/2024  9:00 AM CENTERING PROVIDER [facility]    [place], MD

## 2024-01-12 ENCOUNTER — Ambulatory Visit: Admitting: Obstetrics

## 2024-01-12 ENCOUNTER — Ambulatory Visit: Attending: Obstetrics and Gynecology

## 2024-01-12 ENCOUNTER — Other Ambulatory Visit: Payer: Self-pay | Admitting: Obstetrics and Gynecology

## 2024-01-12 VITALS — BP 120/56 | HR 83

## 2024-01-12 DIAGNOSIS — O361120 Maternal care for Anti-A sensitization, second trimester, not applicable or unspecified: Secondary | ICD-10-CM | POA: Insufficient documentation

## 2024-01-12 DIAGNOSIS — O99322 Drug use complicating pregnancy, second trimester: Secondary | ICD-10-CM

## 2024-01-12 DIAGNOSIS — O0990 Supervision of high risk pregnancy, unspecified, unspecified trimester: Secondary | ICD-10-CM | POA: Insufficient documentation

## 2024-01-12 DIAGNOSIS — O360120 Maternal care for anti-D [Rh] antibodies, second trimester, not applicable or unspecified: Secondary | ICD-10-CM

## 2024-01-12 DIAGNOSIS — Z3182 Encounter for Rh incompatibility status: Secondary | ICD-10-CM | POA: Diagnosis present

## 2024-01-12 DIAGNOSIS — O360930 Maternal care for other rhesus isoimmunization, third trimester, not applicable or unspecified: Secondary | ICD-10-CM | POA: Diagnosis not present

## 2024-01-12 DIAGNOSIS — O98512 Other viral diseases complicating pregnancy, second trimester: Secondary | ICD-10-CM

## 2024-01-12 DIAGNOSIS — Z3A25 25 weeks gestation of pregnancy: Secondary | ICD-10-CM

## 2024-01-12 DIAGNOSIS — F17210 Nicotine dependence, cigarettes, uncomplicated: Secondary | ICD-10-CM

## 2024-01-12 DIAGNOSIS — B009 Herpesviral infection, unspecified: Secondary | ICD-10-CM

## 2024-01-12 DIAGNOSIS — O09212 Supervision of pregnancy with history of pre-term labor, second trimester: Secondary | ICD-10-CM

## 2024-01-12 NOTE — Progress Notes (Signed)
 MFM Consult Note  Erin Tran is currently at 25 weeks and 2 days.  She has been followed due to isoimmunization with anti-D and anti-C antibodies detected earlier in her pregnancy.  Her anti-D antibody titer level drawn last month was 1:32 and the anti-C antibody titer level was 1: 2.  She had the Unity cell free DNA test that predicts that the fetus does not carry the C or the D antigens and therefore should not be at risk for anemia.  There was normal amniotic fluid noted today.  The peak systolic velocity of the middle cerebral artery was less than 1.5 multiple of the median for her gestational age, indicating that her fetus is not anemic at this time.   There were no signs of fetal hydrops noted on today's exam. The patient was reassured by today's findings.   The implications and management of isoimmunization was discussed with the patient today.    We will continue to follow her with monthly exams to assess the MCA Doppler studies and to assess for signs of fetal hydrops.  She will return in 2 weeks for another ultrasound exam to assess the fetal growth.    The patient stated that all of her questions were answered today.  A total of 20 minutes was spent counseling and coordinating the care for this patient.  Greater than 50% of the time was spent in direct face-to-face contact.

## 2024-01-14 ENCOUNTER — Ambulatory Visit: Payer: Self-pay | Admitting: Obstetrics and Gynecology

## 2024-01-14 VITALS — BP 113/68 | HR 102 | Wt 208.0 lb

## 2024-01-14 DIAGNOSIS — O26893 Other specified pregnancy related conditions, third trimester: Secondary | ICD-10-CM

## 2024-01-14 DIAGNOSIS — O0993 Supervision of high risk pregnancy, unspecified, third trimester: Secondary | ICD-10-CM

## 2024-01-14 DIAGNOSIS — O0990 Supervision of high risk pregnancy, unspecified, unspecified trimester: Secondary | ICD-10-CM

## 2024-01-14 DIAGNOSIS — Z3182 Encounter for Rh incompatibility status: Secondary | ICD-10-CM

## 2024-01-14 DIAGNOSIS — Z3A29 29 weeks gestation of pregnancy: Secondary | ICD-10-CM | POA: Diagnosis not present

## 2024-01-14 DIAGNOSIS — O99333 Smoking (tobacco) complicating pregnancy, third trimester: Secondary | ICD-10-CM

## 2024-01-14 DIAGNOSIS — O99332 Smoking (tobacco) complicating pregnancy, second trimester: Secondary | ICD-10-CM

## 2024-01-14 DIAGNOSIS — Z8619 Personal history of other infectious and parasitic diseases: Secondary | ICD-10-CM

## 2024-01-14 DIAGNOSIS — Z6791 Unspecified blood type, Rh negative: Secondary | ICD-10-CM

## 2024-01-14 NOTE — Progress Notes (Signed)
 PRENATAL VISIT NOTE Centering Pregnancy Group 19, Session 4  Subjective:  Erin Tran is a 28 y.o. [id number] at [redacted]w[redacted]d being seen today for ongoing prenatal care.  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  Patient reports no complaints.  Contractions: Not present.  .  Movement: Present. Denies leaking of fluid.   The following portions of the patient's history were reviewed and updated as appropriate: allergies, current medications, past family history, past medical history, past social history, past surgical history and problem list.   Objective:    Vitals:   01/14/24 0918  BP: 113/68  Pulse: (!) 102  Weight: 208 lb (94.3 kg)    Fetal Status:  Fetal Heart Rate (bpm): 142 Fundal Height: 27 cm Movement: Present    General: Alert, oriented and cooperative. Patient is in no acute distress.  Skin: Skin is warm and dry. No rash noted.   Cardiovascular: Normal heart rate noted  Respiratory: Normal respiratory effort, no problems with respiration noted  Abdomen: Soft, gravid, appropriate for gestational age.  Pain/Pressure: Absent     Pelvic: Cervical exam deferred        Extremities: Normal range of motion.     Mental Status: Normal mood and affect. Normal behavior. Normal judgment and thought content.   Assessment and Plan:  Pregnancy: [id number] at [redacted]w[redacted]d 1. [redacted] weeks gestation of pregnancy (Primary) 2. Supervision of high risk pregnancy, antepartum  Centering Pregnancy, Session#4: Reviewed resources in [employer].   Facilitated discussion today:  Family planning postpartum, and preterm labor Mindfulness activity completed as well as deep breathing with hand massage for childbirth preparation.     Fundal height and FHR appropriate today unless noted otherwise in plan. Patient to continue group care.    OGTT, Rhogam and 3rd trimester labs to be scheduled at check out for 28 weeks   2. Rh negative state in antepartum period 3. 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  Her anti-D antibody titer level 4 weeks ago was 1:32 and the anti-C antibody titer level was 1:2. - Unity cell free DNA test that predicts that the fetus does not carry the C or the D antigens and therefore should not be at risk for anemia. - 5/5 US  peak systolic velocity of the middle cerebral artery was less than 1.5 multiple of the median for her gestational age, indicating that her fetus is not anemic at that time.  - continue monthly exams to assess the MCA Doppler studies and to assess for signs of fetal hydrops.    4. History of PCR DNA positive for HSV2 Suppression at 34-36 weeks with Valtrex    5. Tobacco smoking affecting pregnancy in second trimester Encouraged discontinuation   Term labor symptoms and general obstetric precautions including but not limited to vaginal bleeding, contractions, leaking of fluid and fetal movement were reviewed in detail with the patient. Please refer to After Visit Summary for other counseling recommendations.   No follow-ups on file.  Future Appointments  Date Time Provider Department Center  01/26/2024 10:00 AM [facility] PROVIDER 1 [facility]  01/26/2024 10:30 AM [facility] US2 [facility]  01/28/2024  9:00 AM CENTERING PROVIDER [facility]  02/11/2024  9:00 AM  CENTERING PROVIDER [facility]  02/25/2024  9:00 AM CENTERING PROVIDER [facility]  03/10/2024  9:00 AM CENTERING PROVIDER [facility]  03/24/2024  9:00 AM CENTERING PROVIDER [facility]  04/07/2024  9:00 AM CENTERING PROVIDER [facility]    Darrow End, MD

## 2024-01-26 ENCOUNTER — Ambulatory Visit: Admitting: Maternal & Fetal Medicine

## 2024-01-26 ENCOUNTER — Other Ambulatory Visit: Payer: Self-pay | Admitting: *Deleted

## 2024-01-26 ENCOUNTER — Ambulatory Visit: Attending: Obstetrics and Gynecology

## 2024-01-26 VITALS — BP 119/62 | HR 96

## 2024-01-26 DIAGNOSIS — O26892 Other specified pregnancy related conditions, second trimester: Secondary | ICD-10-CM

## 2024-01-26 DIAGNOSIS — Z3182 Encounter for Rh incompatibility status: Secondary | ICD-10-CM

## 2024-01-26 DIAGNOSIS — O360120 Maternal care for anti-D [Rh] antibodies, second trimester, not applicable or unspecified: Secondary | ICD-10-CM | POA: Diagnosis present

## 2024-01-26 DIAGNOSIS — O09212 Supervision of pregnancy with history of pre-term labor, second trimester: Secondary | ICD-10-CM

## 2024-01-26 DIAGNOSIS — O0990 Supervision of high risk pregnancy, unspecified, unspecified trimester: Secondary | ICD-10-CM | POA: Insufficient documentation

## 2024-01-26 DIAGNOSIS — Z3A27 27 weeks gestation of pregnancy: Secondary | ICD-10-CM | POA: Insufficient documentation

## 2024-01-26 DIAGNOSIS — F17210 Nicotine dependence, cigarettes, uncomplicated: Secondary | ICD-10-CM

## 2024-01-26 DIAGNOSIS — O99332 Smoking (tobacco) complicating pregnancy, second trimester: Secondary | ICD-10-CM

## 2024-01-26 DIAGNOSIS — O98512 Other viral diseases complicating pregnancy, second trimester: Secondary | ICD-10-CM

## 2024-01-26 DIAGNOSIS — B009 Herpesviral infection, unspecified: Secondary | ICD-10-CM

## 2024-01-26 NOTE — Progress Notes (Signed)
 Patient information  Patient Name: Erin Tran  Patient MRN:   [id number]  Referring practice: MFM Referring Provider: [facility])  MFM CONSULT  Erin Tran is a 28 y.o. [id number] at [redacted]w[redacted]d here for ultrasound and consultation. 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  Alloimmunization Pt O negative 02/17 showed increased anti-D titers (1:16) and increased anti-C (big C) antibodies (too weak to titer). No hx VB. No Rhogam received. Today MCA dopplers are normal without evidence of hydrops.  Unity testing was also done that showed negative for the fetal D and C antigen, meaning various no set up for the fetus to have alloimmunization based on this test.  However since the test is not new or and has limitations we will continue to perform some MCA Dopplers throughout the pregnancy.  These can be discontinued at any time as long as the patient understands the implications of the Unity test.  Sonographic findings Single intrauterine pregnancy at 27w 2d.  Fetal cardiac activity:  Observed and appears normal. Presentation: Cephalic. Interval fetal anatomy appears normal. Fetal biometry shows the estimated fetal weight at the 34 percentile. Amniotic fluid volume: Within normal limits. MVP: 6.85 cm. Placenta: Posterior. MCA dopplers were normal with MoM at 1.26  There are limitations of prenatal ultrasound such as the inability to detect certain abnormalities due to poor visualization. Various factors such as fetal position, gestational age and maternal body habitus may increase the difficulty in visualizing the fetal  anatomy.    Recommendations - MCA dopplers can either be continued or discontinued at the patients preference due to negative fetal antigen testing.  - BPPs can occur at 32 weeks for tobacco use  Review of Systems: A review of systems was performed and was negative except per HPI   Vitals and Physical Exam    01/26/2024    9:54 AM 01/14/2024    9:18 AM 01/12/2024   10:12 AM  Vitals with BMI  Weight  208 lbs   Systolic 119 113 147  Diastolic 62 68 56  Pulse 96 102 83    Sitting comfortably on the sonogram table Nonlabored breathing Normal rate and rhythm Abdomen is nontender  Past pregnancies OB History  Gravida Para Term Preterm AB Living  7 4 3 1 2 4   SAB IAB Ectopic Multiple Live Births  1 0 0 0 4    # Outcome Date GA Lbr Len/2nd Weight Sex Type Anes PTL Lv  7 Current           6 Term 04/09/19 [redacted]w[redacted]d 02:24 / 00:19 6 lb 14.2 oz (3.124 kg) M Vag-Spont EPI  LIV  5 Preterm 06/10/17 [redacted]w[redacted]d / 00:19 6 lb 5.9 oz (2.89 kg) M Vag-Spont EPI  LIV  4 Term 06/29/15 [redacted]w[redacted]d / 00:45 8 lb 2.3 oz (3.695 kg) M Vag-Spont EPI  LIV  3 Term 12/08/12 [redacted]w[redacted]d 13:00 / 00:38 7 lb 9 oz (3.43 kg) M Vag-Spont EPI  LIV  2 AB 2012 [redacted]w[redacted]d    SAB     1 SAB  I spent 10 minutes reviewing the patients chart, including labs and images as well as counseling the patient about her medical conditions. Greater than 50% of the time was spent in direct face-to-face patient counseling.  Erin Tran  MFM, [facility]   01/26/2024  10:45 AM

## 2024-01-28 ENCOUNTER — Other Ambulatory Visit: Payer: Self-pay

## 2024-01-28 ENCOUNTER — Other Ambulatory Visit

## 2024-01-28 ENCOUNTER — Ambulatory Visit: Payer: Self-pay | Admitting: Obstetrics and Gynecology

## 2024-01-28 VITALS — BP 103/62 | HR 76 | Wt 208.6 lb

## 2024-01-28 DIAGNOSIS — O26892 Other specified pregnancy related conditions, second trimester: Secondary | ICD-10-CM

## 2024-01-28 DIAGNOSIS — O99332 Smoking (tobacco) complicating pregnancy, second trimester: Secondary | ICD-10-CM | POA: Diagnosis not present

## 2024-01-28 DIAGNOSIS — F17210 Nicotine dependence, cigarettes, uncomplicated: Secondary | ICD-10-CM | POA: Diagnosis not present

## 2024-01-28 DIAGNOSIS — Z3A27 27 weeks gestation of pregnancy: Secondary | ICD-10-CM

## 2024-01-28 DIAGNOSIS — Z8619 Personal history of other infectious and parasitic diseases: Secondary | ICD-10-CM

## 2024-01-28 DIAGNOSIS — Z6791 Unspecified blood type, Rh negative: Secondary | ICD-10-CM

## 2024-01-28 DIAGNOSIS — Z3182 Encounter for Rh incompatibility status: Secondary | ICD-10-CM

## 2024-01-28 DIAGNOSIS — O360120 Maternal care for anti-D [Rh] antibodies, second trimester, not applicable or unspecified: Secondary | ICD-10-CM

## 2024-01-28 DIAGNOSIS — O0990 Supervision of high risk pregnancy, unspecified, unspecified trimester: Secondary | ICD-10-CM

## 2024-01-28 DIAGNOSIS — Z1332 Encounter for screening for maternal depression: Secondary | ICD-10-CM

## 2024-01-28 NOTE — Progress Notes (Unsigned)
 PRENATAL VISIT NOTE Centering Pregnancy Group 19, Session 5   Subjective:  Erin Tran is a 28 y.o. [id number] at [redacted]w[redacted]d being seen today for ongoing prenatal care.  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  Patient reports no complaints- comes with 28 yo Erin Tran.  Contractions: Not present.  .  Movement: Present. Denies leaking of fluid.   The following portions of the patient's history were reviewed and updated as appropriate: allergies, current medications, past family history, past medical history, past social history, past surgical history and problem list.   Objective:    Vitals:   01/28/24 0838  BP: 103/62  Pulse: 76  Weight: 208 lb 9.6 oz (94.6 kg)    Fetal Status:  Fetal Heart Rate (bpm): 151 Fundal Height: 27 cm Movement: Present Presentation: Vertex  General: Alert, oriented and cooperative. Patient is in no acute distress.  Skin: Skin is warm and dry. No rash noted.   Cardiovascular: Normal heart rate noted  Respiratory: Normal respiratory effort, no problems with respiration noted  Abdomen: Soft, gravid, appropriate for gestational age.  Pain/Pressure: Absent     Pelvic: Cervical exam deferred        Extremities: Normal range of motion.     Mental Status: Normal mood and affect. Normal behavior. Normal judgment and thought content.   Assessment and Plan:  Pregnancy: [id number] at [redacted]w[redacted]d 1. [redacted] weeks gestation of pregnancy (Primary) 2. Supervision of high risk pregnancy, antepartum  Centering Pregnancy, Session#5: Reviewed resources in [employer].   Facilitated discussion today:  Stages of Labor, Sign of labor, labor positions, coping strategies.   Reviewed deep relaxation breathing and use of pool noodle on low back for intrapartum massage.   Fundal height and FHR appropriate today unless noted otherwise in plan. Patient to continue group care.    3. 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 Rh negative state in antepartum period O negative 02/17 showed increased anti-D titers (1:16) and increased anti-C (big C) antibodies (too weak to titer). No hx VB. No Rhogam received.  -MCA dopplers are normal without evidence of hydrops.   -Unity testing was also done by MFM that showed negative for the fetal D and C antigen, meaning various no set up for the fetus to have alloimmunization based on this test.   - Continue MCA dopplers per MFM  5. History of PCR DNA positive for HSV2 Suppression at 34-36 weeks with Valtrex   6. Tobacco smoking affecting pregnancy in second trimester Encouraged discontinuation   Preterm labor symptoms and general obstetric precautions including but not limited to vaginal bleeding, contractions, leaking of fluid and fetal movement were reviewed in detail with the patient. Please refer to After Visit Summary for other counseling recommendations.   No follow-ups on file.  Future Appointments  Date Time Provider Department Center  01/28/2024  9:00 AM Darrow End, MD [facility]  02/11/2024  9:00 AM CENTERING PROVIDER [facility]  02/25/2024  9:00 AM CENTERING PROVIDER [facility]  03/01/2024 11:00 AM [facility] PROVIDER 1 [facility]  03/01/2024 11:30 AM [facility] US3 [facility]  03/08/2024 11:00 AM [facility] PROVIDER 1 [facility]  03/08/2024 11:30 AM [facility] US3 [facility]  03/10/2024  9:00 AM CENTERING PROVIDER [facility]  [facility]  03/15/2024  9:00 AM [facility] PROVIDER 1 [facility]  03/15/2024  9:30 AM [facility] US3 [facility]  03/22/2024  9:00 AM [facility] PROVIDER 1 [facility]  03/22/2024  9:30 AM [facility] US3 [facility]  03/24/2024  9:00 AM CENTERING PROVIDER [facility]  04/07/2024  9:00 AM CENTERING PROVIDER  [facility]    Darrow End, MD

## 2024-02-02 LAB — AB SCR+ANTIBODY ID
Antibody Screen: POSITIVE — AB
Coombs Titer #1: 16

## 2024-02-02 LAB — GLUCOSE TOLERANCE, 2 HOURS W/ 1HR
Glucose, 1 hour: 115 mg/dL (ref 70–179)
Glucose, 2 hour: 70 mg/dL (ref 70–152)
Glucose, Fasting: 83 mg/dL (ref 70–91)

## 2024-02-02 LAB — COMPREHENSIVE METABOLIC PANEL WITH GFR
ALT: 9 IU/L (ref 0–32)
AST: 12 IU/L (ref 0–40)
Albumin: 3.5 g/dL — ABNORMAL LOW (ref 4.0–5.0)
Alkaline Phosphatase: 94 IU/L (ref 44–121)
BUN/Creatinine Ratio: 13 (ref 9–23)
BUN: 8 mg/dL (ref 6–20)
Bilirubin Total: <0.2 mg/dL (ref 0.0–1.2)
CO2: 19 mmol/L — ABNORMAL LOW (ref 20–29)
Calcium: 9.1 mg/dL (ref 8.7–10.2)
Chloride: 103 mmol/L (ref 96–106)
Creatinine, Ser: 0.62 mg/dL (ref 0.57–1.00)
Globulin, Total: 2.4 g/dL (ref 1.5–4.5)
Glucose: 80 mg/dL (ref 70–99)
Potassium: 4 mmol/L (ref 3.5–5.2)
Sodium: 136 mmol/L (ref 134–144)
Total Protein: 5.9 g/dL — ABNORMAL LOW (ref 6.0–8.5)
eGFR: 124 mL/min/{1.73_m2} (ref >59)

## 2024-02-02 LAB — CBC
Hematocrit: 32 % — ABNORMAL LOW (ref 34.0–46.6)
Hemoglobin: 10.5 g/dL — ABNORMAL LOW (ref 11.1–15.9)
MCH: 31.9 pg (ref 26.6–33.0)
MCHC: 32.8 g/dL (ref 31.5–35.7)
MCV: 97 fL (ref 79–97)
Platelets: 264 10*3/uL (ref 150–450)
RBC: 3.29 x10E6/uL — ABNORMAL LOW (ref 3.77–5.28)
RDW: 12.4 % (ref 11.7–15.4)
WBC: 10.9 10*3/uL — ABNORMAL HIGH (ref 3.4–10.8)

## 2024-02-02 LAB — ABO AND RH: Rh Factor: NEGATIVE

## 2024-02-02 LAB — HIV ANTIBODY (ROUTINE TESTING W REFLEX): HIV Screen 4th Generation wRfx: NONREACTIVE

## 2024-02-02 LAB — ANTIBODY SCREEN

## 2024-02-02 LAB — RPR: RPR Ser Ql: NONREACTIVE

## 2024-02-11 ENCOUNTER — Other Ambulatory Visit: Payer: Self-pay

## 2024-02-11 ENCOUNTER — Ambulatory Visit (INDEPENDENT_AMBULATORY_CARE_PROVIDER_SITE_OTHER): Payer: Self-pay | Admitting: Obstetrics and Gynecology

## 2024-02-11 VITALS — BP 115/72 | HR 79 | Wt 208.6 lb

## 2024-02-11 DIAGNOSIS — O99333 Smoking (tobacco) complicating pregnancy, third trimester: Secondary | ICD-10-CM

## 2024-02-11 DIAGNOSIS — Z3A29 29 weeks gestation of pregnancy: Secondary | ICD-10-CM | POA: Diagnosis not present

## 2024-02-11 DIAGNOSIS — Z6791 Unspecified blood type, Rh negative: Secondary | ICD-10-CM

## 2024-02-11 DIAGNOSIS — Z3182 Encounter for Rh incompatibility status: Secondary | ICD-10-CM

## 2024-02-11 DIAGNOSIS — O0990 Supervision of high risk pregnancy, unspecified, unspecified trimester: Secondary | ICD-10-CM | POA: Diagnosis not present

## 2024-02-11 DIAGNOSIS — O26899 Other specified pregnancy related conditions, unspecified trimester: Secondary | ICD-10-CM | POA: Diagnosis not present

## 2024-02-11 DIAGNOSIS — Z8619 Personal history of other infectious and parasitic diseases: Secondary | ICD-10-CM

## 2024-02-11 NOTE — Progress Notes (Signed)
 PRENATAL VISIT NOTE Centering Pregnancy Group 19, Session 6  Subjective:  Erin Tran is a 28 y.o. [id number] at [redacted]w[redacted]d being seen today for ongoing prenatal care.  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  Patient reports no complaints.  Contractions: Not present.  .  Movement: Present. Denies leaking of fluid.   The following portions of the patient's history were reviewed and updated as appropriate: allergies, current medications, past family history, past medical history, past social history, past surgical history and problem list.   Objective:    Vitals:   02/11/24 0904  BP: 115/72  Pulse: 79  Weight: 208 lb 9.6 oz (94.6 kg)    Fetal Status:  Fetal Heart Rate (bpm): 140 Fundal Height: 30 cm Movement: Present Presentation: Vertex  General: Alert, oriented and cooperative. Patient is in no acute distress.  Skin: Skin is warm and dry. No rash noted.   Cardiovascular: Normal heart rate noted  Respiratory: Normal respiratory effort, no problems with respiration noted  Abdomen: Soft, gravid, appropriate for gestational age.  Pain/Pressure: Absent     Pelvic: Cervical exam deferred        Extremities: Normal range of motion.     Mental Status: Normal mood and affect. Normal behavior. Normal judgment and thought content.   Assessment and Plan:  Pregnancy: [name] at [redacted]w[redacted]d 1. [redacted] weeks gestation of pregnancy (Primary) 2. Supervision of high risk pregnancy, antepartum  Centering Pregnancy, Session#6:  Reviewed resources in [employer].  Facilitated discussion today: family planning/reproductive life planning, coping strategies   Mindfulness activity with positive  affirmations   Fundal height and FHR appropriate today unless noted otherwise in plan. Patient to continue group care.    3. Tobacco smoking affecting pregnancy in third trimester Encouraged discontinuation   4. Rh negative state in antepartum period 5. 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 No hx VB. No Rhogam received.  -MCA dopplers are normal without evidence of hydrops.   -Unity testing was also done by MFM that showed negative for the fetal D and C antigen, meaning various no set up for the fetus to have alloimmunization based on this test.   - Continue MCA dopplers per MFM  6. History of PCR DNA positive for HSV2 Suppression at 34-36 weeks with Valtrex   Preterm labor symptoms and general obstetric precautions including but not limited to vaginal bleeding, contractions, leaking of fluid and fetal movement were reviewed in detail with the patient. Please refer to After Visit Summary for other counseling recommendations.   No follow-ups on file.  Future Appointments  Date Time Provider Department Center  02/25/2024  9:00 AM CENTERING PROVIDER [facility]  03/01/2024 11:00 AM [facility] PROVIDER 1 [facility]  03/01/2024 11:30 AM [facility] US3 [facility]  03/08/2024 11:00 AM [facility] PROVIDER 1 [facility]  03/08/2024 11:30 AM [facility] US3 [facility]  03/10/2024  9:00 AM CENTERING PROVIDER [facility]  03/15/2024  9:00 AM [facility] PROVIDER 1 [facility]  03/15/2024  9:30 AM [facility] US3 [facility]  03/22/2024  9:00 AM [facility] PROVIDER 1 [facility]  03/22/2024  9:30 AM [facility] US3 [facility]  03/24/2024  9:00 AM CENTERING PROVIDER [facility]  04/07/2024  9:00 AM CENTERING PROVIDER [facility]    Darrow End, MD

## 2024-02-11 NOTE — Patient Instructions (Addendum)
 Hand expression of Colostrum:  [email or web address]  [email or web address]

## 2024-02-23 ENCOUNTER — Ambulatory Visit

## 2024-02-25 ENCOUNTER — Ambulatory Visit (INDEPENDENT_AMBULATORY_CARE_PROVIDER_SITE_OTHER): Payer: Self-pay | Admitting: Obstetrics and Gynecology

## 2024-02-25 DIAGNOSIS — O26893 Other specified pregnancy related conditions, third trimester: Secondary | ICD-10-CM

## 2024-02-25 DIAGNOSIS — Z3182 Encounter for Rh incompatibility status: Secondary | ICD-10-CM

## 2024-02-25 DIAGNOSIS — Z6791 Unspecified blood type, Rh negative: Secondary | ICD-10-CM | POA: Diagnosis not present

## 2024-02-25 DIAGNOSIS — O360130 Maternal care for anti-D [Rh] antibodies, third trimester, not applicable or unspecified: Secondary | ICD-10-CM | POA: Diagnosis not present

## 2024-02-25 DIAGNOSIS — Z3A31 31 weeks gestation of pregnancy: Secondary | ICD-10-CM | POA: Insufficient documentation

## 2024-03-01 ENCOUNTER — Ambulatory Visit: Attending: Maternal & Fetal Medicine

## 2024-03-01 ENCOUNTER — Ambulatory Visit (HOSPITAL_BASED_OUTPATIENT_CLINIC_OR_DEPARTMENT_OTHER): Admitting: Obstetrics and Gynecology

## 2024-03-01 VITALS — BP 119/56

## 2024-03-01 DIAGNOSIS — O403XX0 Polyhydramnios, third trimester, not applicable or unspecified: Secondary | ICD-10-CM | POA: Diagnosis present

## 2024-03-01 DIAGNOSIS — O0990 Supervision of high risk pregnancy, unspecified, unspecified trimester: Secondary | ICD-10-CM | POA: Diagnosis present

## 2024-03-01 DIAGNOSIS — Z3A32 32 weeks gestation of pregnancy: Secondary | ICD-10-CM | POA: Insufficient documentation

## 2024-03-01 DIAGNOSIS — O09213 Supervision of pregnancy with history of pre-term labor, third trimester: Secondary | ICD-10-CM

## 2024-03-01 DIAGNOSIS — O361930 Maternal care for other isoimmunization, third trimester, not applicable or unspecified: Secondary | ICD-10-CM

## 2024-03-01 DIAGNOSIS — O99333 Smoking (tobacco) complicating pregnancy, third trimester: Secondary | ICD-10-CM

## 2024-03-01 DIAGNOSIS — O98513 Other viral diseases complicating pregnancy, third trimester: Secondary | ICD-10-CM

## 2024-03-01 DIAGNOSIS — Z3182 Encounter for Rh incompatibility status: Secondary | ICD-10-CM | POA: Insufficient documentation

## 2024-03-01 DIAGNOSIS — B009 Herpesviral infection, unspecified: Secondary | ICD-10-CM

## 2024-03-01 NOTE — Progress Notes (Signed)
 Maternal-Fetal Medicine Consultation Name: Rhylen Pulido MRN: [id number]  G7 [id number] at 32w 2d gestation. Patient's blood type is O negative and recent antibody screening assessed on 01/28/2024 showed increased anti-D titers (1:16), which has not increased. She has increased anti-C (big C) antibodies (too weak to titer). UNITY screening showed Big C antigen and D antigen were not detected.  She does not have gestational diabetes.  Blood pressure today at our office 119/56 mmHg.  Ultrasound Mild polyhydramnios is seen.  Fetal growth is appropriate for gestational age.  Middle cerebral artery Doppler showed normal peak systolic velocity measurements (no evidence of fetal anemia).  Antenatal testing is reassuring.  BPP 8/8.  I explained the finding of mild polyhydramnios.  In the absence of gestational diabetes, it is usually idiopathic (no known cause) and is not associated with adverse fetal outcomes.  I reassured the patient of Doppler findings.  Recommendations - We have canceled appointments for her weekly antenatal testing and MCA Doppler studies. - Fetal growth assessment in 3 weeks.   Consultation including face-to-face (more than 50%) counseling 10 minutes.

## 2024-03-02 ENCOUNTER — Ambulatory Visit

## 2024-03-02 ENCOUNTER — Other Ambulatory Visit

## 2024-03-08 ENCOUNTER — Ambulatory Visit

## 2024-03-10 ENCOUNTER — Ambulatory Visit (INDEPENDENT_AMBULATORY_CARE_PROVIDER_SITE_OTHER): Payer: Self-pay | Admitting: Obstetrics and Gynecology

## 2024-03-10 VITALS — BP 115/71 | HR 90 | Wt 213.8 lb

## 2024-03-10 DIAGNOSIS — Z3182 Encounter for Rh incompatibility status: Secondary | ICD-10-CM

## 2024-03-10 DIAGNOSIS — O0990 Supervision of high risk pregnancy, unspecified, unspecified trimester: Secondary | ICD-10-CM | POA: Diagnosis not present

## 2024-03-10 DIAGNOSIS — Z8619 Personal history of other infectious and parasitic diseases: Secondary | ICD-10-CM

## 2024-03-10 DIAGNOSIS — O360130 Maternal care for anti-D [Rh] antibodies, third trimester, not applicable or unspecified: Secondary | ICD-10-CM | POA: Diagnosis not present

## 2024-03-10 DIAGNOSIS — O403XX0 Polyhydramnios, third trimester, not applicable or unspecified: Secondary | ICD-10-CM

## 2024-03-10 DIAGNOSIS — Z3A33 33 weeks gestation of pregnancy: Secondary | ICD-10-CM | POA: Insufficient documentation

## 2024-03-10 DIAGNOSIS — O409XX0 Polyhydramnios, unspecified trimester, not applicable or unspecified: Secondary | ICD-10-CM

## 2024-03-10 DIAGNOSIS — Z6791 Unspecified blood type, Rh negative: Secondary | ICD-10-CM

## 2024-03-10 DIAGNOSIS — O26899 Other specified pregnancy related conditions, unspecified trimester: Secondary | ICD-10-CM

## 2024-03-10 MED ORDER — VALACYCLOVIR HCL 500 MG PO TABS: 500.0000 mg | ORAL_TABLET | Freq: Every day | ORAL | 12 refills | Status: DC

## 2024-03-10 NOTE — Progress Notes (Signed)
   PRENATAL VISIT NOTE Centering Pregnancy Group 19, Session 8  Subjective:  Erin Tran is a 28 y.o. [id number] at [redacted]w[redacted]d being seen today for ongoing prenatal care.  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  Patient reports occasional contractions.  Contractions: Irregular.  .  Movement: Present. Denies leaking of fluid.   The following portions of the patient's history were reviewed and updated as appropriate: allergies, current medications, past family history, past medical history, past social history, past surgical history and problem list.   Objective:    Vitals:   03/10/24 0907  BP: 115/71  Pulse: 90  Weight: 213 lb 12.8 oz (97 kg)    Fetal Status:  Fetal Heart Rate (bpm): 146 Fundal Height: 33 cm Movement: Present Presentation: Vertex  General: Alert, oriented and cooperative. Patient is in no acute distress.  Skin: Skin is warm and dry. No rash noted.   Cardiovascular: Normal heart rate noted  Respiratory: Normal respiratory effort, no problems with respiration noted  Abdomen: Soft, gravid, appropriate for gestational age.  Pain/Pressure: Absent     Pelvic: Cervical exam deferred        Extremities: Normal range of motion.     Mental Status: Normal mood and affect. Normal behavior. Normal judgment and thought content.   Assessment and Plan:  Pregnancy: [id number] at [redacted]w[redacted]d 1. [redacted] weeks gestation of pregnancy (Primary) 2. Supervision of high risk pregnancy, antepartum   Centering Pregnancy, Session#8: Reviewed resources in [employer].   Facilitated discussion today:  Newborn safety, delivery planning, postpartum planning and follow other topics (family  planning, breastfeeding)  Fundal height and FHR appropriate today unless noted otherwise in plan. Patient to continue group care.    3. History of PCR DNA positive for HSV2 Start Valtrex  until delivery  4. 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 Rh negative state in antepartum period Patient's blood type is O negative and recent antibody screening assessed on 01/28/2024 showed increased anti-D titers (1:16), which has not increased. She has increased anti-C (big C) antibodies (too weak to titer). UNITY screening showed Big C antigen and D antigen were not detected.   6. Polyhydramnios affecting pregnancy Follow up US  7/28  Preterm labor symptoms and general obstetric precautions including but not limited to vaginal bleeding, contractions, leaking of fluid and fetal movement were reviewed in detail with the patient. Please refer to After Visit Summary for other counseling recommendations.   No follow-ups on file.  Future Appointments  Date Time Provider Department Center  03/22/2024  9:00 AM [facility] PROVIDER 1 [facility]  03/22/2024  9:30 AM [facility] US3 [facility]  03/24/2024  9:00 AM CENTERING PROVIDER [facility]  04/07/2024  9:00 AM CENTERING PROVIDER [facility]    Mardy Shropshire, MD

## 2024-03-15 ENCOUNTER — Ambulatory Visit

## 2024-03-22 ENCOUNTER — Ambulatory Visit (HOSPITAL_BASED_OUTPATIENT_CLINIC_OR_DEPARTMENT_OTHER): Admitting: Obstetrics

## 2024-03-22 ENCOUNTER — Ambulatory Visit: Attending: Maternal & Fetal Medicine

## 2024-03-22 DIAGNOSIS — Z3A35 35 weeks gestation of pregnancy: Secondary | ICD-10-CM | POA: Insufficient documentation

## 2024-03-22 DIAGNOSIS — O0990 Supervision of high risk pregnancy, unspecified, unspecified trimester: Secondary | ICD-10-CM | POA: Diagnosis present

## 2024-03-22 DIAGNOSIS — O360130 Maternal care for anti-D [Rh] antibodies, third trimester, not applicable or unspecified: Secondary | ICD-10-CM

## 2024-03-22 DIAGNOSIS — O361930 Maternal care for other isoimmunization, third trimester, not applicable or unspecified: Secondary | ICD-10-CM | POA: Diagnosis not present

## 2024-03-22 DIAGNOSIS — O99333 Smoking (tobacco) complicating pregnancy, third trimester: Secondary | ICD-10-CM | POA: Diagnosis not present

## 2024-03-22 DIAGNOSIS — Z3182 Encounter for Rh incompatibility status: Secondary | ICD-10-CM | POA: Diagnosis present

## 2024-03-22 DIAGNOSIS — B009 Herpesviral infection, unspecified: Secondary | ICD-10-CM

## 2024-03-22 DIAGNOSIS — O98513 Other viral diseases complicating pregnancy, third trimester: Secondary | ICD-10-CM

## 2024-03-22 DIAGNOSIS — O09213 Supervision of pregnancy with history of pre-term labor, third trimester: Secondary | ICD-10-CM | POA: Diagnosis not present

## 2024-03-22 DIAGNOSIS — O403XX0 Polyhydramnios, third trimester, not applicable or unspecified: Secondary | ICD-10-CM

## 2024-03-22 NOTE — Progress Notes (Signed)
 MFM Consult Note  Erin Tran is currently at 35 weeks and 2 days.  She has been followed due to isoimmunization.  Her most recent anti-D antibody titer level was 1 and 16 and her anti-C antibody titer level was too weak to titer.  She had the Unity cell free DNA test which predicts that the fetus does not carry the Big C antigen or the D antigen on it's red blood cells.  Therefore, her fetus should not be at risk for anemia.  On today's exam, the overall EFW 5 pounds 5 ounces measures at the 24th percentile for her gestational age.    There was normal amniotic fluid noted with a total AFI of 19.4 cm.  The peak systolic velocity of the middle cerebral artery was less than 1.5 multiple of the median for her gestational age, indicating that her fetus is not anemic at this time.   There were no signs of fetal hydrops noted on today's exam. The patient was reassured by today's findings.   As the fetal growth is within normal limits and her fetus should not be at risk for anemia, no further exams were scheduled in our office.    The patient was advised to have her baby examined after birth to ensure that anemia is not present.    The patient stated that all of her questions were answered today.  A total of 30 minutes was spent counseling and coordinating the care for this patient.  Greater than 50% of the time was spent in direct face-to-face contact.

## 2024-03-24 ENCOUNTER — Ambulatory Visit (INDEPENDENT_AMBULATORY_CARE_PROVIDER_SITE_OTHER): Payer: Self-pay

## 2024-03-24 VITALS — BP 111/72 | HR 86 | Wt 217.4 lb

## 2024-03-24 DIAGNOSIS — Z3A37 37 weeks gestation of pregnancy: Secondary | ICD-10-CM | POA: Insufficient documentation

## 2024-03-24 DIAGNOSIS — Z6791 Unspecified blood type, Rh negative: Secondary | ICD-10-CM

## 2024-03-24 DIAGNOSIS — O99333 Smoking (tobacco) complicating pregnancy, third trimester: Secondary | ICD-10-CM

## 2024-03-24 DIAGNOSIS — O26899 Other specified pregnancy related conditions, unspecified trimester: Secondary | ICD-10-CM

## 2024-03-24 DIAGNOSIS — Z3A35 35 weeks gestation of pregnancy: Secondary | ICD-10-CM | POA: Diagnosis not present

## 2024-03-24 DIAGNOSIS — O09893 Supervision of other high risk pregnancies, third trimester: Secondary | ICD-10-CM

## 2024-03-24 DIAGNOSIS — Z8619 Personal history of other infectious and parasitic diseases: Secondary | ICD-10-CM

## 2024-03-24 DIAGNOSIS — Z3182 Encounter for Rh incompatibility status: Secondary | ICD-10-CM

## 2024-03-24 DIAGNOSIS — B009 Herpesviral infection, unspecified: Secondary | ICD-10-CM

## 2024-03-24 NOTE — Patient Instructions (Signed)
   Considering Waterbirth? Guide for patients at Center for [facility]) Why consider waterbirth? Gentle birth for babies  Less pain medicine used in labor  May allow for passive descent/less pushing  May reduce perineal tears  More mobility and instinctive maternal position changes  Increased maternal relaxation   Is waterbirth safe? What are the risks of infection, drowning or other complications? Infection:  Very low risk (3.7 % for tub vs 4.8% for bed)  7 in 8000 waterbirths with documented infection  Poorly cleaned equipment most common cause  Slightly lower group B strep transmission rate  Drowning  Maternal:  Very low risk  Related to seizures or fainting  Newborn:  Very low risk. No evidence of increased risk of respiratory problems in multiple large studies  Physiological protection from breathing under water  Avoid underwater birth if there are any fetal complications  Once baby's head is out of the water, keep it out.  Birth complication  Some reports of cord trauma, but risk decreased by bringing baby to surface gradually  No evidence of increased risk of shoulder dystocia. Mothers can usually change positions faster in water than in a bed, possibly aiding the maneuvers to free the shoulder.   There are 2 things you MUST do to have a waterbirth with [facility]: Attend a waterbirth class at [facility] at [facility]   3rd Wednesday of every month from 7-9 pm (virtual during COVID) [employer] at [email or web address] or [email or web address] or by calling [phone] Bring [place] the certificate from the class to your prenatal appointment or send via MyChart Meet with a midwife at 36 weeks* to see if you can still plan a waterbirth and to sign the consent.   *We also recommend that you schedule as many of your prenatal visits with a midwife as possible.    Helpful information: You may want to bring a bathing suit top to the hospital  to wear during labor but this is optional.  All other supplies are provided by the hospital. Please arrive at the hospital with signs of active labor, and do not wait at home until late in labor. It takes 45 min- 1 hour for fetal monitoring, and check in to your room to take place, plus transport and filling of the waterbirth tub.    Things that would prevent you from having a waterbirth: Premature, <37wks  Previous cesarean birth  Presence of thick meconium-stained fluid  Multiple gestation (Twins, triplets, etc.)  Uncontrolled diabetes or gestational diabetes requiring medication  Hypertension diagnosed in pregnancy or preexisting hypertension (gestational hypertension, preeclampsia, or chronic hypertension) Fetal growth restriction (your baby measures less than 10th percentile on ultrasound) Heavy vaginal bleeding  Non-reassuring fetal heart rate  Active infection (MRSA, etc.). Group B Strep is NOT a contraindication for waterbirth.  If your labor has to be induced and induction method requires continuous monitoring of the baby's heart rate  Other risks/issues identified by your obstetrical provider   Please remember that birth is unpredictable. Under certain unforeseeable circumstances your provider may advise against giving birth in the tub. These decisions will be made on a case-by-case basis and with the safety of you and your baby as our highest priority.    Updated 11/28/21

## 2024-03-24 NOTE — Progress Notes (Signed)
   PRENATAL VISIT NOTE [facility] Pregnancy Group [facility], Session 9  Subjective:  Erin Tran is a 28 y.o. H2E6875 at [redacted]w[redacted]d being seen today for ongoing prenatal care.  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  Patient reports rough day.  Contractions: Not present.  .  Movement: Present. Denies leaking of fluid.   The following portions of the patient's history were reviewed and updated as appropriate: allergies, current medications, past family history, past medical history, past social history, past surgical history and problem list.   Objective:    Vitals:   03/24/24 0954  BP: 111/72  Pulse: 86  Weight: 217 lb 6.4 oz (98.6 kg)    Fetal Status:  Fetal Heart Rate (bpm): 156 Fundal Height: 35 cm Movement: Present Presentation: Vertex  General: Alert, oriented and cooperative. Patient is in no acute distress.  Skin: Skin is warm and dry. No rash noted.   Cardiovascular: Normal heart rate noted  Respiratory: Normal respiratory effort, no problems with respiration noted  Abdomen: Soft, gravid, appropriate for gestational age.  Pain/Pressure: Absent     Pelvic: Cervical exam deferred        Extremities: Normal range of motion.  Edema: Trace  Mental Status: Normal mood and affect. Normal behavior. Normal judgment and thought content.   Assessment and Plan:  Pregnancy: H2E6875 at [redacted]w[redacted]d 1. [redacted] weeks gestation of pregnancy (Primary)  Centering Pregnancy, Session#9: Reviewed resources in [employer].   Facilitated discussion today:  baby safety around the home, demonstration of soothing techniques for infants Mindfulness activity with visualization of a special place as  well as relaxation breathing when thinking about fears or unexpected outcomes for childbirth preparation  Fundal height and FHR appropriate today unless noted otherwise in plan. Patient to continue group care.    GBS & GC/Chlamydia swabs next week  2. History of PCR DNA positive for HSV2 Valtrex  prophylaxis  3. 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 Rh negative state in antepartum period Rh (anti-D 1:16; anti-C too weak to titer. UNITY test Big C Antigen Not  Detected, D Antigen Not Detected 12-01-23) Her most recent anti-D antibody titer level was 1 and 16 and her anti-C antibody titer level was too weak to titer. She had the Unity cell free DNA test which predicts that the fetus does not carry the Big C antigen or the D antigen on it's red blood cells.  Therefore, her fetus should not be at risk for anemia.   The patient was advised to have her baby examined after birth to ensure that anemia is not present.  5. Tobacco smoking affecting pregnancy in third trimester Encouraged cessation  Preterm labor symptoms and general obstetric precautions including but not limited to vaginal bleeding, contractions, leaking of fluid and fetal movement were reviewed in detail with the patient. Please refer to After Visit Summary for other counseling recommendations.   No follow-ups on file.  Future Appointments  Date Time Provider Department Center  04/07/2024  9:00 AM CENTERING PROVIDER [facility]    Mardy Shropshire, MD

## 2024-03-29 NOTE — Progress Notes (Signed)
 Subjective Chief Complaint  Patient presents with  . Dermatitis   History of Present Illness: Erin Tran is a 28 y.o. female who presents for Dupixent and atopic dermatitis follow up. Reports no side effects.  [redacted] weeks pregnant.  Last injection was around a week ago.    Patient denies any other new/changing/growing lesions or non-healing/ulcerated lesions. No other concerns today.   Personal history of skin cancer: None  Family history of melanoma: None Family history of other skin cancer: None  Past medical, social, and family history have been updated and reviewed. Medications and allergies updated and reviewed.   ROS negative for other lumps, bumps or dermatologic concerns. No fever/chills.   Objective Ht 1.676 m (5' 6)   Wt 102 kg (224 lb)   BMI 36.15 kg/m  On physical exam, patient is 28 y.o. female who appears alert and oriented, in no apparent distress, with appropriate mood and affect. Exam was performed today via inspection and palpation of the skin. Exam findings include the following:  Skin Exam 1. INTRINSIC ATOPIC DERMATITIS Generalized This Visit - dupilumab (DUPIXENT) 300 mg/2 mL pnij - Inject 2 mLs (300 mg total) into the skin every 14 days. 2. ATOPIC DERMATITIS, UNSPECIFIED TYPE   This Visit - triamcinolone acetonide (KENALOG) 0.1 % ointment - Apply twice daily as needed to areas of eczema on the body. Existing Treatments - dupilumab (DUPIXENT) 300 mg/2 mL pnij - Inject 2 mLs (300 mg total) into the skin every 14 days. 3. MEDICATION MONITORING ENCOUNTER   This Visit - dupilumab (DUPIXENT) 300 mg/2 mL pnij - Inject 2 mLs (300 mg total) into the skin every 14 days.     Assessment 1. Intrinsic atopic dermatitis  dupilumab (DUPIXENT) 300 mg/2 mL pnij   DISCONTINUED: dupilumab (DUPIXENT) 300 mg/2 mL pnij    2. Atopic dermatitis, unspecified type  triamcinolone acetonide (KENALOG) 0.1 % ointment    3. Medication monitoring encounter   dupilumab (DUPIXENT) 300 mg/2 mL pnij   DISCONTINUED: dupilumab (DUPIXENT) 300 mg/2 mL pnij       Plan  The above diagnosis and treatment options were reviewed with the patient. Educated the patient about the benign nature of some of the lesions noted on today's exam. Encouraged use of sunscreen with an SPF of at least 30, and other sun-protective measures including hats and long-sleeved clothing. Advised regular self-skin exams to look for any changing or new lesions that appear suspicious, and we have reviewed ABCDE criteria. The patient was encouraged to call or send a message through [facility] for any questions or concerns.  Return in about 6 months (around 09/29/2024) for virtual, du[ixent, atopic derm follow up. - but sooner as needed  Orders Placed This Encounter  Medications  . DISCONTD: dupilumab (DUPIXENT) 300 mg/2 mL pnij    Sig: Inject 2 mLs (300 mg total) into the skin every 14 days.    Dispense:  4 mL    Refill:  11  . triamcinolone acetonide (KENALOG) 0.1 % ointment    Sig: Apply twice daily as needed to areas of eczema on the body.    Dispense:  454 g    Refill:  6  . dupilumab (DUPIXENT) 300 mg/2 mL pnij    Sig: Inject 2 mLs (300 mg total) into the skin every 14 days.    Dispense:  4 mL    Refill:  11   No orders of the defined types were placed in this encounter.

## 2024-03-31 ENCOUNTER — Other Ambulatory Visit (HOSPITAL_COMMUNITY)
Admission: RE | Admit: 2024-03-31 | Discharge: 2024-03-31 | Disposition: A | Discharge status: Home / Self Care | Source: Ambulatory Visit | Attending: Obstetrics & Gynecology | Admitting: Obstetrics & Gynecology

## 2024-03-31 ENCOUNTER — Encounter: Payer: Self-pay | Admitting: Obstetrics & Gynecology

## 2024-03-31 ENCOUNTER — Ambulatory Visit: Admitting: Obstetrics & Gynecology

## 2024-03-31 ENCOUNTER — Other Ambulatory Visit: Payer: Self-pay

## 2024-03-31 VITALS — BP 119/73 | HR 96 | Wt 214.1 lb

## 2024-03-31 DIAGNOSIS — O0993 Supervision of high risk pregnancy, unspecified, third trimester: Secondary | ICD-10-CM

## 2024-03-31 DIAGNOSIS — Z8619 Personal history of other infectious and parasitic diseases: Secondary | ICD-10-CM

## 2024-03-31 DIAGNOSIS — Z1332 Encounter for screening for maternal depression: Secondary | ICD-10-CM

## 2024-03-31 DIAGNOSIS — Z3A36 36 weeks gestation of pregnancy: Secondary | ICD-10-CM

## 2024-03-31 DIAGNOSIS — O0990 Supervision of high risk pregnancy, unspecified, unspecified trimester: Secondary | ICD-10-CM

## 2024-03-31 NOTE — Progress Notes (Signed)
 PRENATAL VISIT NOTE  Subjective:  Erin Tran is a 28 y.o. H2E6875 at [redacted]w[redacted]d being seen today for ongoing prenatal care.  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  Patient reports no complaints.  Contractions: Irritability. Vag. Bleeding: None.  Movement: Present. Denies leaking of fluid.   The following portions of the patient's history were reviewed and updated as appropriate: allergies, current medications, past family history, past medical history, past social history, past surgical history and problem list.   Objective:    Vitals:   03/31/24 0905  BP: 119/73  Pulse: 96  Weight: 214 lb 1.6 oz (97.1 kg)    Fetal Status:  Fetal Heart Rate (bpm): 134   Movement: Present Presentation: Vertex  General: Alert, oriented and cooperative. Patient is in no acute distress.  Skin: Skin is warm and dry. No rash noted.   Cardiovascular: Normal heart rate noted  Respiratory: Normal respiratory effort, no problems with respiration noted  Abdomen: Soft, gravid, appropriate for gestational age.  Pain/Pressure: Absent     Pelvic: Cervical exam performed in the presence of a chaperone Dilation: 1 Effacement (%): Thick Station: Ballotable. Cultures obtained.  Extremities: Normal range of motion.  Edema: Trace  Mental Status: Normal mood and affect. Normal behavior. Normal judgment and thought content.   US  MFM OB FOLLOW UP Result Date: 03/22/2024 ----------------------------------------------------------------------  OBSTETRICS REPORT                       (Signed Final 03/22/2024 05:00 pm) ---------------------------------------------------------------------- Patient Info  ID #:        [id number]                          D.O.B.:  Jun 13, 1996 (28 yrs)(F)  Name:       Erin Tran               Visit Date: 03/22/2024 09:16 am ---------------------------------------------------------------------- Performed By  Attending:        Steffan Keys MD         Ref. Address:     [address]                                                             [place], [place]                                                             [address]  Performed By:     Joyann Allen RDMS      [facility]  Referred By:      SUZEN MARYAN MASTERS MD ---------------------------------------------------------------------- Orders  #  Description                           Code        Ordered By  1  US  MFM OB FOLLOW UP                   23183.98    DELORA SMALLER ----------------------------------------------------------------------  #  Order #                     Accession #                Episode #  1  [id number]                   [id number]                 [id number] ---------------------------------------------------------------------- Indications  Isoimmunization - Rh (anti-D 1:16; anti-C too  O36.1990  weak to titer. UNITY test Big C Antigen Not  Detected, D Antigen Not Detected 12-01-23)  Polyhydramnios, third trimester, antepartum    O40.3XX0  condition or complication, unspecified  fetus(resolved)  Smoking complicating pregnancy, third          O99.333  trimester  Poor obstetric history: Previous preterm       O09.219  delivery, antepartum ([redacted]w[redacted]d)  Herpes simplex virus (HSV)                     O98.519 B00.9  [redacted] weeks gestation of pregnancy                Z3A.35  Encounter for other antenatal screening        Z36.2  follow-up  ---------------------------------------------------------------------- Vital Signs  BP:          123/63 ---------------------------------------------------------------------- Fetal Evaluation  Num Of Fetuses:         1  Fetal Heart Rate(bpm):  139  Cardiac Activity:       Observed  Presentation:           Cephalic  Placenta:               Right lateral  Amniotic Fluid  AFI FV:      Within normal limits  AFI Sum(cm)     %Tile       Largest Pocket(cm)  19.41           72          6.17  RUQ(cm)  RLQ(cm)       LUQ(cm)        LLQ(cm)  4.93          6.17          3.61           4.7 ---------------------------------------------------------------------- Biometry  BPD:      84.8  mm     G. Age:  34w 1d         22  %    CI:        78.29   %    70 - 86                                                          FL/HC:      22.0   %    20.1 - 22.3  HC:      303.2  mm     G. Age:  33w 5d          2  %    HC/AC:      0.99        0.93 - 1.11  AC:      306.8  mm     G. Age:  34w 4d         38  %    FL/BPD:     78.8   %    71 - 87  FL:       66.8  mm     G. Age:  34w 3d         21  %    FL/AC:      21.8   %    20 - 24  Est. FW:    2423  gm      5 lb 5 oz     24  % ---------------------------------------------------------------------- OB History  Gravidity:    7         Term:   3        Prem:   1        SAB:   2  TOP:          0       Ectopic:  0        Living: 4 ---------------------------------------------------------------------- Gestational Age  LMP:           35w 2d        Date:  07/19/23                 EDD:   04/24/24  U/S Today:     34w 2d                                        EDD:   05/01/24  Best:          35w 2d     Det. By:  LMP  (07/19/23)          EDD:   04/24/24 ---------------------------------------------------------------------- Anatomy  Cranium:               Previously seen        Aortic Arch:            Previously seen  Cavum:                 Previously seen        Ductal Arch:            Previously seen   Ventricles:            Appears normal         Diaphragm:              Appears normal  Choroid Plexus:        Previously seen        Stomach:                Appears normal, left                                                                        sided  Cerebellum:            Previously seen        Abdomen:                Previously seen  Posterior Fossa:       Previously seen        Abdominal Wall:         Previously seen  Face:                  Orbits and profile     Cord Vessels:           Previously seen                         previously seen  Lips:                  Previously seen        Kidneys:                Appear normal  Thoracic:              Previously seen        Bladder:                Appears normal  Heart:                 Appears normal         Spine:                  Previously seen                         (4CH, axis, and                         situs)  RVOT:                  Previously seen        Upper Extremities:      Previously seen  LVOT:                  Previously seen        Lower Extremities:      Previously seen ---------------------------------------------------------------------- Doppler - Fetal Vessels  Middle Cerebral Artery  PSV   MoM                                                     (cm/s)                                                      57.08  1.12 ---------------------------------------------------------------------- Cervix Uterus Adnexa  Cervix  Not visualized (advanced GA >24wks)  Uterus  No abnormality visualized.  Right Ovary  Not visualized.  Left Ovary  Not visualized.  Cul De Sac  No free fluid seen.  Adnexa  No abnormality visualized ---------------------------------------------------------------------- Comments  Danial Essex is currently at 35 weeks and 2 days.  She  has been followed due to isoimmunization.  Her most recent anti-D antibody titer level was 1 and 16 and  her anti-C antibody titer level was too  weak to titer.  She had the Unity cell free DNA test which predicts that the  fetus does not carry the Big C antigen or the D antigen on it's  red blood cells.  Therefore, her fetus should not be at risk for  anemia.  On today's exam, the overall EFW 5 pounds 5 ounces  measures at the 24th percentile for her gestational age.  There was normal amniotic fluid noted with a total AFI of 19.4  cm.  The peak systolic velocity of the middle cerebral artery was  less than 1.5 multiple of the median for her gestational age,  indicating that her fetus is not anemic at this time.  There were no signs of fetal hydrops noted on today's exam.  The patient was reassured by today's findings.  As the fetal growth is within normal limits and her fetus  should not be at risk for anemia, no further exams were  scheduled in our office.  The patient was advised to have her baby examined after  birth to ensure that anemia is not present.  The patient stated that all of her questions were answered  today.  A total of 30 minutes was spent counseling and coordinating  the care for this patient.  Greater than 50% of the time was  spent in direct face-to-face contact. ----------------------------------------------------------------------                   Steffan Keys, MD Electronically Signed Final Report   03/22/2024 05:00 pm ----------------------------------------------------------------------   US  MFM FETAL BPP WO NON STRESS Result Date: 03/01/2024 ----------------------------------------------------------------------  OBSTETRICS REPORT                       (Signed Final 03/01/2024 01:46 pm) ---------------------------------------------------------------------- Patient Info  ID #:       [id number]                          D.O.B.:  02-03-1996 (27 yrs)(F)  Name:       Erin Tran               Visit Date: 03/01/2024 11:04 am ---------------------------------------------------------------------- Performed By  Attending:        Fredia Fresh  MD  Ref. Address:     [address]                                                             [place], [place]                                                             [address]  Performed By:     Comer Harrow       [facility]  Referred By:      SUZEN MARYAN MASTERS MD ---------------------------------------------------------------------- Orders  #  Description                           Code        Ordered By  1  US  MFM FETAL BPP WO NON               76819.01    BURK SCHAIBLE     STRESS  2  US  MFM MCA DOPPLER                    76821.01    BURK SCHAIBLE  3  US  MFM OB FOLLOW UP                   23183.98    DELORA SMALLER ----------------------------------------------------------------------  #  Order #                     Accession #                Episode #  1  [id number]                   [id number]                 [id number]  2  [id number]                   [id number]                 [id number]  3  [id number]                   [id number]                 [id number] ---------------------------------------------------------------------- Indications  Isoimmunization - Rh (anti-D 1:16; anti-C too  O36.1990  weak to titer. UNITY test Big C Antigen Not  Detected, D Antigen Not Detected 12-01-23)  Polyhydramnios, third trimester, antepartum    O40.3XX0  condition or complication, unspecified fetus  Smoking complicating pregnancy, third          O99.333  trimester  Poor obstetric history: Previous preterm       O09.219  delivery, antepartum ([redacted]w[redacted]d)  Herpes simplex virus (HSV)                     O98.519 B00.9  Encounter for other antenatal screening        Z36.2  follow-up  [redacted] weeks gestation of pregnancy                Z3A.32  ---------------------------------------------------------------------- Vital Signs  BP:          119/56 ---------------------------------------------------------------------- Fetal Evaluation  Num Of Fetuses:         1  Fetal Heart Rate(bpm):  137  Cardiac Activity:       Observed  Presentation:           Cephalic  Placenta:               Right lateral  P. Cord Insertion:      Previously seen  Amniotic Fluid  AFI FV:      Polyhydramnios  AFI Sum(cm)     %Tile       Largest Pocket(cm)  25.95           96          9.88  RUQ(cm)       RLQ(cm)       LUQ(cm)        LLQ(cm)  9.88          5.43          5.21           5.43 ---------------------------------------------------------------------- Biophysical Evaluation  Amniotic F.V:   Pocket => 2 cm             F. Tone:        Observed  F. Movement:    Observed                   Score:          8/8  F. Breathing:   Observed ---------------------------------------------------------------------- Biometry  BPD:      79.7  mm     G. Age:  32w 0d         33  %    CI:        76.17   %    70 - 86  FL/HC:      21.4   %    19.1 - 21.3  HC:      289.4  mm     G. Age:  31w 6d          9  %    HC/AC:      1.04        0.96 - 1.17  AC:      278.9  mm     G. Age:  32w 0d         39  %    FL/BPD:     77.8   %    71 - 87  FL:         62  mm     G. Age:  32w 1d         33  %    FL/AC:      22.2   %    20 - 24  LV:        3.4  mm  Est. FW:    1881  gm      4 lb 2 oz     30  % ---------------------------------------------------------------------- OB History  Gravidity:    7         Term:   3        Prem:   1        SAB:   2  TOP:          0       Ectopic:  0        Living: 4 ---------------------------------------------------------------------- Gestational Age  LMP:           32w 2d        Date:  07/19/23                 EDD:   04/24/24  U/S Today:     32w 0d                                        EDD:   04/26/24  Best:          32w 2d      Det. By:  LMP  (07/19/23)          EDD:   04/24/24 ---------------------------------------------------------------------- Anatomy  Cranium:               Appears normal         Aortic Arch:            Previously seen  Cavum:                 Previously seen        Ductal Arch:            Previously seen  Ventricles:            Appears normal         Diaphragm:              Appears normal  Choroid Plexus:        Previously seen        Stomach:                Appears normal, left  sided  Cerebellum:            Previously seen        Abdomen:                Previously seen  Posterior Fossa:       Previously seen        Abdominal Wall:         Previously seen  Face:                  Orbits and profile     Cord Vessels:           Previously seen                         previously seen  Lips:                  Previously seen        Kidneys:                Appear normal  Thoracic:              Previously seen        Bladder:                Appears normal  Heart:                 Previously seen        Spine:                  Previously seen  RVOT:                  Previously seen        Upper Extremities:      Previously seen  LVOT:                  Previously seen        Lower Extremities:      Previously seen  Other:  Female gender previously seen. Fetal anatomic survey complete on          prior scans. ---------------------------------------------------------------------- Doppler - Fetal Vessels  Middle Cerebral Artery                                                       PSV   MoM                                                     (cm/s)                                                      53.16  1.19 ---------------------------------------------------------------------- Impression  G7 P4024 at 32w 2d gestation.  Patient's blood type is O negative and recent antibody  screening assessed on 01/28/2024 showed increased anti-D  titers (1:16), which has  not increased. She has increased anti-  C (big C) antibodies (too weak to titer). UNITY screening  showed Big C antigen  and D antigen were not detected.  She does not have gestational diabetes.  Blood pressure  today at our office 119/56 mmHg.  Ultrasound  Mild polyhydramnios is seen.  Fetal growth is appropriate for  gestational age.  Middle cerebral artery Doppler showed  normal peak systolic velocity measurements (no evidence of  fetal anemia).  Antenatal testing is reassuring.  BPP 8/8.  I explained the finding of mild polyhydramnios.  In the  absence of gestational diabetes, it is usually idiopathic (no  known cause) and is not associated with adverse fetal  outcomes.  I reassured the patient of Doppler findings. ---------------------------------------------------------------------- Recommendations  - We have canceled appointments for her weekly antenatal  testing and MCA Doppler studies.  - Fetal growth assessment in 3 weeks. ----------------------------------------------------------------------                 Fredia Fresh, MD Electronically Signed Final Report   03/01/2024 01:46 pm ----------------------------------------------------------------------   US  MFM MCA DOPPLER Result Date: 03/01/2024 ----------------------------------------------------------------------  OBSTETRICS REPORT                       (Signed Final 03/01/2024 01:46 pm) ---------------------------------------------------------------------- Patient Info  ID #:       [id number]                          D.O.B.:  04-11-1996 (27 yrs)(F)  Name:       Erin Tran               Visit Date: 03/01/2024 11:04 am ---------------------------------------------------------------------- Performed By  Attending:        Fredia Fresh MD        Ref. Address:     [address]                                                             [place], [place]                                                             [address]  Performed By:     Comer Harrow        [facility]  Referred By:      SUZEN MARYAN MASTERS MD ---------------------------------------------------------------------- Orders  #  Description                           Code        Ordered By  1  US  MFM FETAL BPP WO NON               23180.98    BURK SCHAIBLE     STRESS  2  US  MFM MCA DOPPLER                    76821.01    BURK SCHAIBLE  3  US  MFM OB FOLLOW UP                   76816.01    [facility] ----------------------------------------------------------------------  #  Order #                     Accession #                Episode #  1  [id number]                   [id number]                 [id number]  2  [id number]                   [id number]                 [id number]  3  [id number]                   [id number]                 [id number] ---------------------------------------------------------------------- Indications  Isoimmunization - Rh (anti-D 1:16; anti-C too  O36.1990  weak to titer. UNITY test Big C Antigen Not  Detected, D Antigen Not Detected 12-01-23)  Polyhydramnios, third trimester, antepartum    O40.3XX0  condition or complication, unspecified fetus  Smoking complicating pregnancy, third          O99.333  trimester  Poor obstetric history: Previous preterm       O09.219  delivery, antepartum ([redacted]w[redacted]d)  Herpes simplex virus (HSV)                     O98.519 B00.9  Encounter for other antenatal screening        Z36.2  follow-up  [redacted] weeks gestation of pregnancy                Z3A.32 ---------------------------------------------------------------------- Vital Signs  BP:          119/56 ---------------------------------------------------------------------- Fetal Evaluation  Num Of Fetuses:         1  Fetal Heart Rate(bpm):  137  Cardiac  Activity:       Observed  Presentation:           Cephalic  Placenta:               Right lateral  P. Cord Insertion:      Previously seen  Amniotic Fluid  AFI FV:      Polyhydramnios  AFI Sum(cm)     %Tile       Largest Pocket(cm)  25.95  96          9.88  RUQ(cm)       RLQ(cm)       LUQ(cm)        LLQ(cm)  9.88          5.43          5.21           5.43 ---------------------------------------------------------------------- Biophysical Evaluation  Amniotic F.V:   Pocket => 2 cm             F. Tone:        Observed  F. Movement:    Observed                   Score:          8/8  F. Breathing:   Observed ---------------------------------------------------------------------- Biometry  BPD:      79.7  mm     G. Age:  32w 0d         33  %    CI:        76.17   %    70 - 86                                                          FL/HC:      21.4   %    19.1 - 21.3  HC:      289.4  mm     G. Age:  31w 6d          9  %    HC/AC:      1.04        0.96 - 1.17  AC:      278.9  mm     G. Age:  32w 0d         39  %    FL/BPD:     77.8   %    71 - 87  FL:         62  mm     G. Age:  32w 1d         33  %    FL/AC:      22.2   %    20 - 24  LV:        3.4  mm  Est. FW:    1881  gm      4 lb 2 oz     30  % ---------------------------------------------------------------------- OB History  Gravidity:    7         Term:   3        Prem:   1        SAB:   2  TOP:          0       Ectopic:  0        Living: 4 ---------------------------------------------------------------------- Gestational Age  LMP:           32w 2d        Date:  07/19/23                 EDD:   04/24/24  U/S Today:     32w 0d  EDD:   04/26/24  Best:          bobbye 2d     Det. By:  LMP  (07/19/23)          EDD:   04/24/24 ---------------------------------------------------------------------- Anatomy  Cranium:               Appears normal         Aortic Arch:            Previously seen  Cavum:                 Previously seen         Ductal Arch:            Previously seen  Ventricles:            Appears normal         Diaphragm:              Appears normal  Choroid Plexus:        Previously seen        Stomach:                Appears normal, left                                                                        sided  Cerebellum:            Previously seen        Abdomen:                Previously seen  Posterior Fossa:       Previously seen        Abdominal Wall:         Previously seen  Face:                  Orbits and profile     Cord Vessels:           Previously seen                         previously seen  Lips:                  Previously seen        Kidneys:                Appear normal  Thoracic:              Previously seen        Bladder:                Appears normal  Heart:                 Previously seen        Spine:                  Previously seen  RVOT:                  Previously seen        Upper Extremities:      Previously seen  LVOT:  Previously seen        Lower Extremities:      Previously seen  Other:  Female gender previously seen. Fetal anatomic survey complete on          prior scans. ---------------------------------------------------------------------- Doppler - Fetal Vessels  Middle Cerebral Artery                                                       PSV   MoM                                                     (cm/s)                                                      53.16  1.19 ---------------------------------------------------------------------- Impression  G7 P4024 at 32w 2d gestation.  Patient's blood type is O negative and recent antibody  screening assessed on 01/28/2024 showed increased anti-D  titers (1:16), which has not increased. She has increased anti-  C (big C) antibodies (too weak to titer). UNITY screening  showed Big C antigen and D antigen were not detected.  She does not have gestational diabetes.  Blood pressure  today at our office 119/56 mmHg.  Ultrasound  Mild  polyhydramnios is seen.  Fetal growth is appropriate for  gestational age.  Middle cerebral artery Doppler showed  normal peak systolic velocity measurements (no evidence of  fetal anemia).  Antenatal testing is reassuring.  BPP 8/8.  I explained the finding of mild polyhydramnios.  In the  absence of gestational diabetes, it is usually idiopathic (no  known cause) and is not associated with adverse fetal  outcomes.  I reassured the patient of Doppler findings. ---------------------------------------------------------------------- Recommendations  - We have canceled appointments for her weekly antenatal  testing and MCA Doppler studies.  - Fetal growth assessment in 3 weeks. ----------------------------------------------------------------------                 Fredia Fresh, MD Electronically Signed Final Report   03/01/2024 01:46 pm ----------------------------------------------------------------------   US  MFM OB FOLLOW UP Result Date: 03/01/2024 ----------------------------------------------------------------------  OBSTETRICS REPORT                       (Signed Final 03/01/2024 01:46 pm) ---------------------------------------------------------------------- Patient Info  ID #:       [id number]                          D.O.B.:  1996-05-24 (27 yrs)(F)  Name:       Erin Tran               Visit Date: 03/01/2024 11:04 am ---------------------------------------------------------------------- Performed By  Attending:        Fredia Fresh MD        Ref. Address:     [address]  [place], [place]                                                             [address]  Performed By:     Comer Harrow       [facility]  Referred  By:      SUZEN MARYAN MASTERS MD ---------------------------------------------------------------------- Orders  #  Description                           Code        Ordered By  1  US  MFM FETAL BPP WO NON               76819.01    BURK SCHAIBLE     STRESS  2  US  MFM MCA DOPPLER                    76821.01    BURK SCHAIBLE  3  US  MFM OB FOLLOW UP                   23183.98    BURK SCHAIBLE ----------------------------------------------------------------------  #  Order #                     Accession #                Episode #  1  [id number]                   [id number]                 [id number]  2  [id number]                   [id number]                 [id number]  3  [id number]                   [id number]  [id number] ---------------------------------------------------------------------- Indications  Isoimmunization - Rh (anti-D 1:16; anti-C too  O36.1990  weak to titer. UNITY test Big C Antigen Not  Detected, D Antigen Not Detected 12-01-23)  Polyhydramnios, third trimester, antepartum    O40.3XX0  condition or complication, unspecified fetus  Smoking complicating pregnancy, third          O99.333  trimester  Poor obstetric history: Previous preterm       O09.219  delivery, antepartum ([redacted]w[redacted]d)  Herpes simplex virus (HSV)                     O98.519 B00.9  Encounter for other antenatal screening        Z36.2  follow-up  [redacted] weeks gestation of pregnancy                Z3A.32 ---------------------------------------------------------------------- Vital Signs  BP:          119/56 ---------------------------------------------------------------------- Fetal Evaluation  Num Of Fetuses:         1  Fetal Heart Rate(bpm):  137  Cardiac Activity:       Observed  Presentation:           Cephalic  Placenta:               Right lateral  P. Cord Insertion:      Previously seen  Amniotic Fluid  AFI FV:      Polyhydramnios  AFI Sum(cm)     %Tile       Largest Pocket(cm)  25.95           96          9.88   RUQ(cm)       RLQ(cm)       LUQ(cm)        LLQ(cm)  9.88          5.43          5.21           5.43 ---------------------------------------------------------------------- Biophysical Evaluation  Amniotic F.V:   Pocket => 2 cm             F. Tone:        Observed  F. Movement:    Observed                   Score:          8/8  F. Breathing:   Observed ---------------------------------------------------------------------- Biometry  BPD:      79.7  mm     G. Age:  32w 0d         33  %    CI:        76.17   %    70 - 86                                                          FL/HC:      21.4   %    19.1 - 21.3  HC:      289.4  mm     G. Age:  31w 6d          9  %    HC/AC:      1.04        0.96 - 1.17  AC:  278.9  mm     G. Age:  32w 0d         39  %    FL/BPD:     77.8   %    71 - 87  FL:         62  mm     G. Age:  32w 1d         33  %    FL/AC:      22.2   %    20 - 24  LV:        3.4  mm  Est. FW:    1881  gm      4 lb 2 oz     30  % ---------------------------------------------------------------------- OB History  Gravidity:    7         Term:   3        Prem:   1        SAB:   2  TOP:          0       Ectopic:  0        Living: 4 ---------------------------------------------------------------------- Gestational Age  LMP:           32w 2d        Date:  07/19/23                 EDD:   04/24/24  U/S Today:     32w 0d                                        EDD:   04/26/24  Best:          32w 2d     Det. By:  LMP  (07/19/23)          EDD:   04/24/24 ---------------------------------------------------------------------- Anatomy  Cranium:               Appears normal         Aortic Arch:            Previously seen  Cavum:                 Previously seen        Ductal Arch:            Previously seen  Ventricles:            Appears normal         Diaphragm:              Appears normal  Choroid Plexus:        Previously seen        Stomach:                Appears normal, left                                                                         sided  Cerebellum:            Previously seen        Abdomen:  Previously seen  Posterior Fossa:       Previously seen        Abdominal Wall:         Previously seen  Face:                  Orbits and profile     Cord Vessels:           Previously seen                         previously seen  Lips:                  Previously seen        Kidneys:                Appear normal  Thoracic:              Previously seen        Bladder:                Appears normal  Heart:                 Previously seen        Spine:                  Previously seen  RVOT:                  Previously seen        Upper Extremities:      Previously seen  LVOT:                  Previously seen        Lower Extremities:      Previously seen  Other:  Female gender previously seen. Fetal anatomic survey complete on          prior scans. ---------------------------------------------------------------------- Doppler - Fetal Vessels  Middle Cerebral Artery                                                       PSV   MoM                                                     (cm/s)                                                      53.16  1.19 ---------------------------------------------------------------------- Impression  G7 P4024 at 32w 2d gestation.  Patient's blood type is O negative and recent antibody  screening assessed on 01/28/2024 showed increased anti-D  titers (1:16), which has not increased. She has increased anti-  C (big C) antibodies (too weak to titer). UNITY screening  showed Big C antigen and D antigen were not detected.  She does not have gestational diabetes.  Blood pressure  today at our office 119/56 mmHg.  Ultrasound  Mild polyhydramnios is seen.  Fetal growth is appropriate for  gestational age.  Middle cerebral artery Doppler showed  normal peak systolic velocity measurements (no evidence of  fetal anemia).  Antenatal testing is reassuring.  BPP 8/8.  I explained the finding of mild  polyhydramnios.  In the  absence of gestational diabetes, it is usually idiopathic (no  known cause) and is not associated with adverse fetal  outcomes.  I reassured the patient of Doppler findings. ---------------------------------------------------------------------- Recommendations  - We have canceled appointments for her weekly antenatal  testing and MCA Doppler studies.  - Fetal growth assessment in 3 weeks. ----------------------------------------------------------------------                 Fredia Fresh, MD Electronically Signed Final Report   03/01/2024 01:46 pm ----------------------------------------------------------------------    Assessment and Plan:  Pregnancy: H2E6875 at [redacted]w[redacted]d 1. 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 - valACYclovir  (VALTREX ) 500 MG tablet; Take 2 tablets (1,000 mg total) by mouth daily.  2. [redacted] weeks gestation of pregnancy 3. Supervision of high risk pregnancy, antepartum (Primary) Cultures done, will follow up results and manage accordingly. - Cervicovaginal ancillary only - Culture, beta strep (group b only)  Preterm labor symptoms and general obstetric precautions including but not limited to vaginal bleeding, contractions, leaking of fluid and fetal movement were reviewed in detail with the patient. Please refer to After Visit Summary for other counseling recommendations.   Return in about 1 week (around 04/07/2024) for OB visits as scheduled.  Future Appointments  Date Time Provider Department Center  04/07/2024  9:00 AM CENTERING PROVIDER [facility]  04/13/2024 11:15 AM Cooleen, Olam LABOR, NP [facility]    Gloris Hugger, MD

## 2024-03-31 NOTE — Patient Instructions (Signed)

## 2024-04-01 LAB — CERVICOVAGINAL ANCILLARY ONLY
Chlamydia: NEGATIVE
Comment: NEGATIVE
Comment: NORMAL
Neisseria Gonorrhea: NEGATIVE

## 2024-04-02 ENCOUNTER — Ambulatory Visit: Payer: Self-pay | Admitting: Obstetrics & Gynecology

## 2024-04-02 DIAGNOSIS — O0990 Supervision of high risk pregnancy, unspecified, unspecified trimester: Secondary | ICD-10-CM

## 2024-04-04 LAB — CULTURE, BETA STREP (GROUP B ONLY): Strep Gp B Culture: NEGATIVE

## 2024-04-07 ENCOUNTER — Ambulatory Visit: Payer: Self-pay | Admitting: Obstetrics and Gynecology

## 2024-04-07 VITALS — BP 113/71 | HR 105 | Wt 216.8 lb

## 2024-04-07 DIAGNOSIS — Z8619 Personal history of other infectious and parasitic diseases: Secondary | ICD-10-CM | POA: Diagnosis not present

## 2024-04-07 DIAGNOSIS — O26899 Other specified pregnancy related conditions, unspecified trimester: Secondary | ICD-10-CM

## 2024-04-07 DIAGNOSIS — Z6791 Unspecified blood type, Rh negative: Secondary | ICD-10-CM

## 2024-04-07 DIAGNOSIS — O26893 Other specified pregnancy related conditions, third trimester: Secondary | ICD-10-CM

## 2024-04-07 DIAGNOSIS — Z3A37 37 weeks gestation of pregnancy: Secondary | ICD-10-CM | POA: Diagnosis not present

## 2024-04-07 DIAGNOSIS — Z3182 Encounter for Rh incompatibility status: Secondary | ICD-10-CM

## 2024-04-07 MED ORDER — VALACYCLOVIR HCL 1 G PO TABS: 1000.0000 mg | ORAL_TABLET | Freq: Two times a day (BID) | ORAL | 1 refills | Status: AC

## 2024-04-07 NOTE — Progress Notes (Signed)
   PRENATAL VISIT NOTE [facility], Session 10   Subjective:  Erin Tran is a 28 y.o. H2E6875 at [redacted]w[redacted]d being seen today for ongoing prenatal care.  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  Patient reports occasional contractions.  Contractions: Irregular. Vag. Bleeding: None.  Movement: Present. Denies leaking of fluid.   The following portions of the patient's history were reviewed and updated as appropriate: allergies, current medications, past family history, past medical history, past social history, past surgical history and problem list.   Objective:    Vitals:   04/07/24 0912  BP: 113/71  Pulse: (!) 105  Weight: 216 lb 12.8 oz (98.3 kg)    Fetal Status:  Fetal Heart Rate (bpm): 140 Fundal Height: 34 cm Movement: Present Presentation: Vertex  General: Alert, oriented and cooperative. Patient is in no acute distress.  Skin: Skin is warm and dry. No rash noted.   Cardiovascular: Normal heart rate noted  Respiratory: Normal respiratory effort, no problems with respiration noted  Abdomen: Soft, gravid, appropriate for gestational age.  Pain/Pressure: Present     Pelvic: Cervical exam performed in the presence of a chaperone Dilation: 2.5 Effacement (%): 30 Station: -3  Extremities: Normal range of motion.     Mental Status: Normal mood and affect. Normal behavior. Normal judgment and thought content.   Assessment and Plan:  Pregnancy: H2E6875 at [redacted]w[redacted]d 1. [redacted] weeks gestation of pregnancy (Primary) FHR and BP wnl  2. 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 - valACYclovir   (VALTREX ) 500 MG tablet; Take 2 tablets (1,000 mg total) by mouth daily.  3. 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 Rh negative state in antepartum period Rh (anti-D 1:16; anti-C too weak to titer. UNITY test Big C Antigen Not  Detected, D Antigen Not Detected 12-01-23) Her most recent anti-D antibody titer level was 1 and 16 and her anti-C antibody titer level was too weak to titer. She had the Unity cell free DNA test which predicts that the fetus does not carry the Big C antigen or the D antigen on it's red blood cells.  Therefore, her fetus should not be at risk for anemia. - baby to be examined after birth for anemia  Term labor symptoms and general obstetric precautions including but not limited to vaginal bleeding, contractions, leaking of fluid and fetal movement were reviewed in detail with the patient. Please refer to After Visit Summary for other counseling recommendations.   No follow-ups on file.  Future Appointments  Date Time Provider Department Center  04/13/2024 11:15 AM Cooleen, Olam LABOR, NP [facility]    Mardy Shropshire, MD

## 2024-04-07 NOTE — Addendum Note (Signed)
 Addended by: Labrenda Lasky R on: 04/07/2024 10:44 AM   Modules accepted: Orders

## 2024-04-13 ENCOUNTER — Ambulatory Visit (INDEPENDENT_AMBULATORY_CARE_PROVIDER_SITE_OTHER): Admitting: Nurse Practitioner

## 2024-04-13 ENCOUNTER — Other Ambulatory Visit: Payer: Self-pay

## 2024-04-13 VITALS — BP 113/72 | HR 84 | Wt 219.1 lb

## 2024-04-13 DIAGNOSIS — O0993 Supervision of high risk pregnancy, unspecified, third trimester: Secondary | ICD-10-CM | POA: Diagnosis not present

## 2024-04-13 DIAGNOSIS — O26893 Other specified pregnancy related conditions, third trimester: Secondary | ICD-10-CM

## 2024-04-13 DIAGNOSIS — Z6791 Unspecified blood type, Rh negative: Secondary | ICD-10-CM

## 2024-04-13 DIAGNOSIS — Z3A38 38 weeks gestation of pregnancy: Secondary | ICD-10-CM | POA: Diagnosis not present

## 2024-04-13 DIAGNOSIS — Z8619 Personal history of other infectious and parasitic diseases: Secondary | ICD-10-CM

## 2024-04-13 DIAGNOSIS — O26899 Other specified pregnancy related conditions, unspecified trimester: Secondary | ICD-10-CM

## 2024-04-13 DIAGNOSIS — O0990 Supervision of high risk pregnancy, unspecified, unspecified trimester: Secondary | ICD-10-CM

## 2024-04-13 NOTE — Progress Notes (Signed)
   PRENATAL VISIT NOTE  Subjective:  Erin Tran is a 28 y.o. [phone] at [redacted]w[redacted]d being seen today for ongoing prenatal care.  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  Patient reports occasional contractions.  Contractions: Irritability. Vag. Bleeding: None.  Movement: Present. Denies leaking of fluid.   The following portions of the patient's history were reviewed and updated as appropriate: allergies, current medications, past family history, past medical history, past social history, past surgical history and problem list.   Objective:   Vitals:   04/13/24 1135  BP: 113/72  Pulse: 84  Weight: 219 lb 1.6 oz (99.4 kg)    Fetal Status: Fetal Heart Rate (bpm): 145   Movement: Present   38  General:  Alert, oriented and cooperative. Patient is in no acute distress.  Skin: Skin is warm and dry. No rash noted.   Cardiovascular: Normal heart rate noted  Respiratory: Normal respiratory effort, no problems with respiration noted  Abdomen: Soft, gravid, appropriate for gestational age.  Pain/Pressure: Present     Pelvic: Cervical exam deferred        Extremities: Normal range of motion.  Edema: None  Mental Status: Normal mood and affect. Normal behavior. Normal judgment and thought content.   Assessment and Plan:  Pregnancy: H2E6875 at [redacted]w[redacted]d  1. 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 UNITY test Big C Antigen Not  Detected, D Antigen Not Detected 12-01-23) Her most recent anti-D antibody titer level was 1 and 16 and her anti-C antibody titer level was too weak to titer. She had the Unity cell free  DNA test which predicts that the fetus does not carry the Big C antigen or the D antigen on it's red blood cells.  Therefore, her fetus should not be at risk for anemia. - baby to be examined after birth for anemia  2. Supervision of high risk pregnancy, antepartum (Primary)   3. [redacted] weeks gestation of pregnancy FHR appropriate FH appropriate   4. History of PCR DNA positive for HSV2 On Valtrex  ppx  Term labor symptoms and general obstetric precautions including but not limited to vaginal bleeding, contractions, leaking of fluid and fetal movement were reviewed in detail with the patient. Please refer to After Visit Summary for other counseling recommendations.   No follow-ups on file.  No future appointments.  Olam DELENA Dalton, NP

## 2024-04-21 ENCOUNTER — Other Ambulatory Visit: Payer: Self-pay

## 2024-04-21 ENCOUNTER — Ambulatory Visit (INDEPENDENT_AMBULATORY_CARE_PROVIDER_SITE_OTHER): Admitting: Family Medicine

## 2024-04-21 VITALS — BP 128/76 | HR 105 | Wt 221.2 lb

## 2024-04-21 DIAGNOSIS — O0990 Supervision of high risk pregnancy, unspecified, unspecified trimester: Secondary | ICD-10-CM

## 2024-04-21 DIAGNOSIS — O0993 Supervision of high risk pregnancy, unspecified, third trimester: Secondary | ICD-10-CM

## 2024-04-21 DIAGNOSIS — Z3A39 39 weeks gestation of pregnancy: Secondary | ICD-10-CM

## 2024-04-21 DIAGNOSIS — O26899 Other specified pregnancy related conditions, unspecified trimester: Secondary | ICD-10-CM

## 2024-04-21 DIAGNOSIS — O26893 Other specified pregnancy related conditions, third trimester: Secondary | ICD-10-CM | POA: Diagnosis not present

## 2024-04-21 DIAGNOSIS — Z8619 Personal history of other infectious and parasitic diseases: Secondary | ICD-10-CM | POA: Diagnosis not present

## 2024-04-21 DIAGNOSIS — Z6791 Unspecified blood type, Rh negative: Secondary | ICD-10-CM | POA: Diagnosis not present

## 2024-04-21 NOTE — Progress Notes (Unsigned)
   PRENATAL VISIT NOTE  Subjective:  Erin Tran is a 28 y.o. [phone] at [redacted]w[redacted]d being seen today for ongoing prenatal care.  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  Patient reports no bleeding, no contractions, no cramping, and no leaking.  Contractions: Not present. Vag. Bleeding: None.  Movement: Present. Denies leaking of fluid.   The following portions of the patient's history were reviewed and updated as appropriate: allergies, current medications, past family history, past medical history, past social history, past surgical history and problem list.   Objective:    Vitals:   04/21/24 1335  BP: 128/76  Pulse: (!) 105  Weight: 221 lb 3.2 oz (100.3 kg)    Fetal Status:  Fetal Heart Rate (bpm): 139   Movement: Present    General: Alert, oriented and cooperative. Patient is in no acute distress.  Skin: Skin is warm and dry. No rash noted.   Cardiovascular: Normal heart rate noted  Respiratory: Normal respiratory effort, no problems with respiration noted  Abdomen: Soft, gravid, appropriate for gestational age.  Pain/Pressure: Present     Pelvic: Cervical exam performed in the presence of a chaperone        Extremities: Normal range of motion.  Edema: None  Mental Status: Normal mood and affect. Normal behavior. Normal judgment and thought content.   Assessment and Plan:  Pregnancy: H2E6875 at [redacted]w[redacted]d 1. Supervision of high risk pregnancy, antepartum (Primary) FHR BP appropriate today  2. Rh negative state in antepartum period RhoGAM eval postpartum  3. History of PCR DNA positive for HSV2 On Valtrex   4. [redacted] weeks gestation of pregnancy  Term labor symptoms and  general obstetric precautions including but not limited to vaginal bleeding, contractions, leaking of fluid and fetal movement were reviewed in detail with the patient. Please refer to After Visit Summary for other counseling recommendations.   No follow-ups on file.  Future Appointments  Date Time Provider Department Center  04/28/2024  8:55 AM Fredirick Glenys GORMAN, MD [facility]  05/01/2024  6:30 AM [facility]CTORINE ROOM [facility] None    Norleen LULLA Rover, MD

## 2024-04-28 ENCOUNTER — Other Ambulatory Visit

## 2024-04-28 ENCOUNTER — Ambulatory Visit: Admitting: Family Medicine

## 2024-04-28 ENCOUNTER — Other Ambulatory Visit: Payer: Self-pay

## 2024-04-28 VITALS — BP 109/73 | HR 96 | Wt 226.1 lb

## 2024-04-28 DIAGNOSIS — O480 Post-term pregnancy: Secondary | ICD-10-CM

## 2024-04-28 DIAGNOSIS — O26899 Other specified pregnancy related conditions, unspecified trimester: Secondary | ICD-10-CM

## 2024-04-28 DIAGNOSIS — Z6791 Unspecified blood type, Rh negative: Secondary | ICD-10-CM

## 2024-04-28 DIAGNOSIS — O0990 Supervision of high risk pregnancy, unspecified, unspecified trimester: Secondary | ICD-10-CM

## 2024-04-28 DIAGNOSIS — Z3A40 40 weeks gestation of pregnancy: Secondary | ICD-10-CM

## 2024-04-28 DIAGNOSIS — Z3182 Encounter for Rh incompatibility status: Secondary | ICD-10-CM

## 2024-04-28 DIAGNOSIS — O26893 Other specified pregnancy related conditions, third trimester: Secondary | ICD-10-CM

## 2024-04-28 DIAGNOSIS — O0993 Supervision of high risk pregnancy, unspecified, third trimester: Secondary | ICD-10-CM

## 2024-04-28 NOTE — Progress Notes (Signed)
   PRENATAL VISIT NOTE  Subjective:  Erin Tran is a 28 y.o. H2E6875 at [redacted]w[redacted]d being seen today for ongoing prenatal care.  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  Patient reports no complaints.  Contractions: Irritability. Vag. Bleeding: None.  Movement: Present. Denies leaking of fluid.   The following portions of the patient's history were reviewed and updated as appropriate: allergies, current medications, past family history, past medical history, past social history, past surgical history and problem list.   Objective:    Vitals:   04/28/24 0911  BP: 109/73  Pulse: 96  Weight: 226 lb 1.6 oz (102.6 kg)    Fetal Status:  Fetal Heart Rate (bpm): 154 Fundal Height: 40 cm Movement: Present Presentation: Vertex  General: Alert, oriented and cooperative. Patient is in no acute distress.  Skin: Skin is warm and dry. No rash noted.   Cardiovascular: Normal heart rate noted  Respiratory: Normal respiratory effort, no problems with respiration noted  Abdomen: Soft, gravid, appropriate for gestational age.  Pain/Pressure: Absent     Pelvic: Cervical exam performed in the presence of a chaperone Dilation: 3.5 Effacement (%): 20, 30 Station: -3  Extremities: Normal range of motion.  Edema: None  Mental Status: Normal mood and affect. Normal behavior. Normal judgment and thought content.  NST:  Baseline: 125 bpm, Variability: Good {> 6 bpm), Accelerations: Reactive, and Decelerations: Absent  Assessment and Plan:  Pregnancy: H2E6875 at [redacted]w[redacted]d 1. Supervision of high risk pregnancy, antepartum (Primary) Continue prenatal care. - US  Fetal BPP W/O Non Stress; Future  2. Rh negative state in antepartum  period No Rhogam Baby is D negative, and she is isoimmunized  3. [redacted] weeks gestation of pregnancy   4. 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 Post-term pregnancy, 40-42 weeks of gestation BPP 8/10 Membranes stripped today - US  Fetal BPP W/O Non Stress; Future  Term labor symptoms and general obstetric precautions including but not limited to vaginal bleeding, contractions, leaking of fluid and fetal movement were reviewed in detail with the patient. Please refer to After Visit Summary for other counseling recommendations.   Return in 1 week (on 05/05/2024).  Future Appointments  Date Time Provider Department Center  04/28/2024 11:35 AM [facility]  05/01/2024  6:30 AM [facility] SCHED ROOM [facility] None    Glenys GORMAN Birk, MD

## 2024-04-29 ENCOUNTER — Telehealth (HOSPITAL_COMMUNITY): Payer: Self-pay | Admitting: *Deleted

## 2024-04-29 ENCOUNTER — Encounter (HOSPITAL_COMMUNITY): Payer: Self-pay | Admitting: *Deleted

## 2024-04-29 NOTE — Telephone Encounter (Signed)
 Preadmission screen

## 2024-05-01 ENCOUNTER — Inpatient Hospital Stay (HOSPITAL_COMMUNITY)

## 2024-05-01 ENCOUNTER — Encounter (HOSPITAL_COMMUNITY): Payer: Self-pay | Admitting: Obstetrics & Gynecology

## 2024-05-01 ENCOUNTER — Inpatient Hospital Stay (HOSPITAL_COMMUNITY)
Admission: RE | Admit: 2024-05-01 | Discharge: 2024-05-03 | DRG: 797 | Disposition: A | Discharge status: Home / Self Care | Payer: Medicaid Other | Attending: Obstetrics and Gynecology | Admitting: Obstetrics and Gynecology

## 2024-05-01 DIAGNOSIS — O0990 Supervision of high risk pregnancy, unspecified, unspecified trimester: Principal | ICD-10-CM

## 2024-05-01 DIAGNOSIS — O360930 Maternal care for other rhesus isoimmunization, third trimester, not applicable or unspecified: Secondary | ICD-10-CM | POA: Diagnosis present

## 2024-05-01 DIAGNOSIS — J4520 Mild intermittent asthma, uncomplicated: Secondary | ICD-10-CM | POA: Diagnosis present

## 2024-05-01 DIAGNOSIS — Z3A41 41 weeks gestation of pregnancy: Secondary | ICD-10-CM

## 2024-05-01 DIAGNOSIS — F17210 Nicotine dependence, cigarettes, uncomplicated: Secondary | ICD-10-CM | POA: Diagnosis present

## 2024-05-01 DIAGNOSIS — O26893 Other specified pregnancy related conditions, third trimester: Secondary | ICD-10-CM | POA: Diagnosis present

## 2024-05-01 DIAGNOSIS — Z833 Family history of diabetes mellitus: Secondary | ICD-10-CM

## 2024-05-01 DIAGNOSIS — Z6791 Unspecified blood type, Rh negative: Secondary | ICD-10-CM

## 2024-05-01 DIAGNOSIS — O480 Post-term pregnancy: Principal | ICD-10-CM | POA: Diagnosis present

## 2024-05-01 DIAGNOSIS — O9832 Other infections with a predominantly sexual mode of transmission complicating childbirth: Secondary | ICD-10-CM | POA: Diagnosis present

## 2024-05-01 DIAGNOSIS — A6000 Herpesviral infection of urogenital system, unspecified: Secondary | ICD-10-CM | POA: Diagnosis present

## 2024-05-01 DIAGNOSIS — O9952 Diseases of the respiratory system complicating childbirth: Secondary | ICD-10-CM | POA: Diagnosis present

## 2024-05-01 DIAGNOSIS — O99214 Obesity complicating childbirth: Secondary | ICD-10-CM | POA: Diagnosis present

## 2024-05-01 DIAGNOSIS — Z3490 Encounter for supervision of normal pregnancy, unspecified, unspecified trimester: Secondary | ICD-10-CM | POA: Diagnosis present

## 2024-05-01 DIAGNOSIS — O99334 Smoking (tobacco) complicating childbirth: Secondary | ICD-10-CM | POA: Diagnosis present

## 2024-05-01 DIAGNOSIS — Z3182 Encounter for Rh incompatibility status: Secondary | ICD-10-CM | POA: Diagnosis present

## 2024-05-01 DIAGNOSIS — Z8619 Personal history of other infectious and parasitic diseases: Secondary | ICD-10-CM | POA: Diagnosis present

## 2024-05-01 DIAGNOSIS — O26899 Other specified pregnancy related conditions, unspecified trimester: Secondary | ICD-10-CM

## 2024-05-01 LAB — CBC
HCT: 33.2 % — ABNORMAL LOW (ref 36.0–46.0)
Hemoglobin: 11.3 g/dL — ABNORMAL LOW (ref 12.0–15.0)
MCH: 32.2 pg (ref 26.0–34.0)
MCHC: 34 g/dL (ref 30.0–36.0)
MCV: 94.6 fL (ref 80.0–100.0)
Platelets: 291 K/uL (ref 150–400)
RBC: 3.51 MIL/uL — ABNORMAL LOW (ref 3.87–5.11)
RDW: 14.5 % (ref 11.5–15.5)
WBC: 12.6 K/uL — ABNORMAL HIGH (ref 4.0–10.5)
nRBC: 0 % (ref 0.0–0.2)

## 2024-05-01 LAB — RPR: RPR Ser Ql: NONREACTIVE

## 2024-05-01 MED ORDER — LACTATED RINGERS IV SOLN: INTRAVENOUS | Status: DC

## 2024-05-01 MED ORDER — ACETAMINOPHEN 325 MG PO TABS
650.0000 mg | ORAL_TABLET | ORAL | Status: DC | PRN
Start: 2024-05-01 — End: 2024-05-02

## 2024-05-01 MED ORDER — ONDANSETRON HCL 4 MG/2ML IJ SOLN: 4.0000 mg | Freq: Four times a day (QID) | INTRAMUSCULAR | Status: DC | PRN

## 2024-05-01 MED ORDER — OXYTOCIN BOLUS FROM INFUSION: 333.0000 mL | Freq: Once | INTRAVENOUS | Status: DC

## 2024-05-01 MED ORDER — OXYTOCIN-SODIUM CHLORIDE 30-0.9 UT/500ML-% IV SOLN
2.5000 [IU]/h | INTRAVENOUS | Status: DC
  Filled 2024-05-01: qty 500

## 2024-05-01 MED ORDER — SOD CITRATE-CITRIC ACID 500-334 MG/5ML PO SOLN: 30.0000 mL | ORAL | Status: DC | PRN

## 2024-05-01 MED ORDER — LIDOCAINE HCL (PF) 1 % IJ SOLN: 30.0000 mL | INTRAMUSCULAR | Status: DC | PRN

## 2024-05-01 MED ORDER — MISOPROSTOL 25 MCG QUARTER TABLET
25.0000 ug | ORAL_TABLET | Freq: Once | ORAL | Status: AC
  Administered 2024-05-01: 25 ug via VAGINAL
  Filled 2024-05-01: qty 1

## 2024-05-01 MED ORDER — LACTATED RINGERS IV SOLN: 500.0000 mL | INTRAVENOUS | Status: DC | PRN

## 2024-05-01 NOTE — Progress Notes (Signed)
 Labor Progress Note Erin Tran is a 28 y.o. H2E6875 at [redacted]w[redacted]d presented for PDIOL  S:  Coping well, using movement. Bedside labor support by partner and doula.   O:  BP 136/68   Pulse 71   Temp 98 F (36.7 C) (Oral)   Ht 5' 6 (1.676 m)   Wt 102.5 kg   LMP 07/19/2023 (Approximate)   BMI 36.48 kg/m   EFM: baseline 150 bpm/ moderate variability/ 15x15 accels/ absent decels  Toco/IUPC: 2-4 SVE: Dilation: 5 Effacement (%): 60 Cervical Position: Posterior Station: -2 Presentation: Vertex Exam by:: Valentin Ming RN Pitocin : 0 mu/min  A/P: 28 y.o. H2E6875 [redacted]w[redacted]d PDIOL 1. Labor: IOL in latent phase, with some cervical change since AROM. Will continue expectant management. Would recommend/plan pitocin  if no change in 4 hours.  2. FWB: Cat 1  3. Pain: Coping well, using movement. Bedside labor support by partner and doula. Plans water birth.  4. GBS neg   Anticipate NVSB.  Erin Tran, CNM 9:12 PM

## 2024-05-01 NOTE — Progress Notes (Signed)
 Labor Progress Note Erin Tran is a 28 y.o. H2E6875 at [redacted]w[redacted]d presented for postdates IOL  S: Doing well, actively moving and using exercise ball.    O:  BP 131/60   Pulse 72   Temp 98 F (36.7 C) (Oral)   Ht 5' 6 (1.676 m)   Wt 102.5 kg   LMP 07/19/2023 (Approximate)   BMI 36.48 kg/m  EFM: 130/moderate variability/acels (+), decels (-)  CVE: Dilation: 4 Effacement (%): 60 Cervical Position: Posterior Station: -2, -3 Presentation: Vertex Exam by:: Dr. Magali   A&P: 28 y.o. H2E6875 [redacted]w[redacted]d  #Labor: Progressing well. AROM performed. Patient would like as little intervention as possible. Expectant management, will augment with low dose pitocin  if necessary. Desires waterbirth #Pain: Per patient request, coping well #FWB: Cat I #GBS negative   Erin Tran Magali, MD 4:49 PM

## 2024-05-01 NOTE — H&P (Signed)
 OBSTETRIC ADMISSION HISTORY AND PHYSICAL  Chrislyn KORI GOINS is 28 y.o. [id number] with IUP at 107w0d 04/24/2024, by Last Menstrual Period presenting for PD IOL. She received her prenatal care at [facility]   ROS (+) FM (-) ctx, VB, LOF. HA, visual changes, CP, SOB, RUQ pain, peripheral edema.   Prenatal History/Complications NURSING  PROVIDER  Conservator, [facility] Dating by LMP c/w U/S at   wks  [facility] Anatomy U/S    Initiated care at  [facility]                 Language  English               LAB RESULTS   Support Person Zachary  Genetics NIPS: LR AFP:       NT/IT (FT only)        Carrier Screen Horizon: Neg  Rhogam  O/Negative/-- (06/04 0903) A1C/GTT Early HgbA1C:  Third trimester 2 hr GTT: nl  Flu Vaccine        TDaP Vaccine   Blood Type O/Negative/-- (06/04 0903)  RSV Vaccine   Antibody Positive, See Final Results (06/04 0903)  COVID Vaccine Not received  Rubella 1.16 (02/17 1129)  Feeding Plan both RPR Non Reactive (06/04 0903)  Contraception unsure HBsAg Negative (02/17 1129)  Circumcision Boy, yes HIV Non Reactive (06/04 0903)  Pediatrician  [facility]  HCVAb Non Reactive (02/17 1129)  Prenatal Classes        BTL Consent   Pap       Diagnosis  Date Value Ref Range Status  10/13/2023     Final    - Negative for intraepithelial lesion or malignancy (NILM)    BTL Pre-payment   GC/CT Initial:   36wks:    VBAC Consent   GBS Negative/-- (08/06 1140)Negative  BRx Optimized? [ ]  yes   [ ]  no      DME Rx [ ]  BP cuff [ ]  Weight Scale Waterbirth  [ ]  Class [ ]  Consent [ ]  CNM visit  PHQ9 & GAD7 [  ] new OB [  ] 28 weeks  [  ] 36 weeks Induction  [ ]  Orders Entered [ ] Foley Y/N   OB History  Gravida Para Term Preterm AB Living  7 4 3 1 2 4   SAB IAB Ectopic Multiple Live Births  1 0 0 0 4    # Outcome Date GA Lbr Len/2nd Weight Sex Type Anes PTL Lv  7 Current           6 Term 04/09/19 [redacted]w[redacted]d 02:24 / 00:19 3124 g M Vag-Spont EPI  LIV  5  Preterm 06/10/17 [redacted]w[redacted]d / 00:19 2890 g M Vag-Spont EPI  LIV  4 Term 06/29/15 [redacted]w[redacted]d / 00:45 3695 g M Vag-Spont EPI  LIV  3 Term 12/08/12 [redacted]w[redacted]d 13:00 / 00:38 3430 g M Vag-Spont EPI  LIV  2 AB 2012 [redacted]w[redacted]d    SAB     1 SAB            Patient Active Problem List   Diagnosis Date Noted   Encounter for induction of labor 05/01/2024   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 She has a history of a TAB in 2012 but reported receiving rhogam.      Patient has h/o alloimmunization   -2014 pregnancy she presented to care at [redacted]w[redacted]d in 06/2012 she was anti D (1:64) Anti C (1:4) positive. She has a history of a TAB in 2012 but reported rec   Full-term premature rupture of membranes 06/08/2017   HSV (herpes simplex virus) anogenital infection    oct. 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 08/11/2018   Morbid obesity (HCC) 01/14/2022   Other specified cough 02/06/2023   Pruritic dermatitis 01/14/2022   Supervision of high risk pregnancy, antepartum 06/27/2015   Clinic [facility] Prenatal Labs    Dating 19 wk u/s Blood type: --/--/O NEG (10/14 1243)     Genetic Screen Too late Antibody:POS (10/14 1243)    Anatomic US  female Rubella:    GTT Third trimester:  RPR: Non Reactive (10/14 1243)      Flu vaccine 06/03/17 HBsAg: Negative (10/14 1438)     TDaP vaccine 06/03/17                                   Rhogam:06/03/17 HIV:       Baby Food Breast                         Vitamin D deficiency 01/14/2022    Past Surgical History: Past Surgical History:  Procedure Laterality Date   WISDOM TOOTH EXTRACTION      Social History Social History   Socioeconomic History   Marital status: Single    Spouse name: Not on file   Number of children: Not on file   Years of education: Not on file   Highest education level: Not on file  Occupational History    Employer: [employer]    Comment: Quit when she found out she was pregnant  Tobacco Use   Smoking status: Every Day    Current packs/day: 0.50    Average packs/day: 0.5 packs/day for 5.7 years (2.8 ttl pk-yrs)    Types: Cigarettes    Start date: 2020   Smokeless tobacco: Never  Vaping Use   Vaping status: Never Used  Substance and Sexual Activity   Alcohol use: No   Drug use: No   Sexual activity: Yes    Birth control/protection: None  Other Topics Concern   Not on file  Social History Narrative   ** Merged History Encounter **       Ms. Schwinn lives with her mother in [place]. FOB of 2nd pregnancy is the same FOB for her 26-year old son and he lives in [place] . They are not on good terms and she is unsure whether or not he will be involved in her son's life. She denies smoke ex   posure, alcohol, or other drug use. She previously worked at [employer] but quit  when she found out she was pregnant.   Social Drivers of Health   Financial Resource Strain: Not at Risk (06/13/2023)   Received from [employer]    How hard is it for you to pay for the very basics like food, housing, heating, medical care, and medications?: 1  Food Insecurity: Not at Risk (06/13/2023)   Received from [facility] Insecurity    Within the past 12 months, you worried that your food would run out before you got money to buy more.:  1  Transportation Needs: Not at Risk (06/13/2023)   Received from [employer] Needs    In the past 12 months, has lack of transportation kept you from medical appointments, meetings, work or from getting things needed for daily living?: 1  Physical Activity: Not on File (01/09/2022)   Received from [facility]   Physical Activity    Physical Activity: 0  Stress: Not on File (01/09/2022)   Received from [facility]   Stress    Stress: 0  Social Connections: Not on File (05/06/2023)   Received from [facility]   Social Connections    Connectedness: 0    Family History: Family History  Problem Relation Age of Onset   Asthma Mother    Seizures Mother    Diabetes Maternal Grandmother    Cancer Neg Hx    Hypertension Neg Hx    Hyperlipidemia Neg Hx    Stroke Neg Hx    Heart disease Neg Hx     Allergies: No Known Allergies  Medications Prior to Admission  Medication Sig Dispense Refill Last Dose/Taking   albuterol  (PROVENTIL  HFA;VENTOLIN  HFA) 108 (90 Base) MCG/ACT inhaler Inhale 2 puffs into the lungs every 6 (six) hours as needed for wheezing or shortness of breath. 1 Inhaler 2 Past Month   Dupilumab (DUPIXENT [place]) Inject 1 Units into the skin every 14 (fourteen) days.   Past Week   Prenatal Vit-Fe Fumarate-FA (PRENATAL MULTIVITAMIN) TABS tablet Take 1 tablet by mouth daily at 12 noon. 30 tablet 11 04/30/2024   valACYclovir  (VALTREX ) 1000 MG tablet Take 1 tablet (1,000 mg total) by mouth 2 (two) times daily. 180 tablet 1 04/30/2024   Blood Pressure Monitoring (BLOOD PRESSURE KIT) DEVI 1 each by Does not apply route as needed. (Patient not taking: Reported on 04/28/2024) 1 each 0    cetirizine  (ZYRTEC  ALLERGY) 10 MG tablet Take 1 tablet (10 mg total) by mouth daily. (Patient not taking: Reported on 03/31/2024) 15 tablet 0 Not Taking   nicotine  (NICODERM CQ  - DOSED IN MG/24 HOURS) 14 mg/24hr patch Place 1 patch (14 mg total) onto the skin daily. (Patient not taking: Reported on 03/31/2024) 28 patch 0  Not Taking     Review of Systems  All systems reviewed and negative except as stated in HPI  PHYSICAL EXAM Blood pressure 129/73, pulse (!) 120, height 5' 6 (1.676 m), weight 102.5 kg, last menstrual period 07/19/2023, unknown if currently breastfeeding. General appearance: alert and cooperative Lungs: respirations nonlabored Heart: tachycardic Abdomen: gravid SSE: no lesions  Fetal monitoringBaseline: 130 bpm, Variability: Good {> 6 bpm), Accelerations: Reactive, and Decelerations: rare variable Uterine activity rare  Dilation: 3 Effacement (%): 70 Station: -2, -3 Exam by:: Dr. Darice Annah PEAK   Prenatal labs: ABO, Rh: --/--/PENDING (09/06 1028) Antibody: PENDING (09/06 1028) Rubella: 1.16 (02/17 1129) RPR: Non Reactive (06/04 0903)  HBsAg: Negative (02/17 1129)  HIV: Non Reactive (06/04 0903)   Lab Results  Component Value Date   GBS Negative 03/31/2024    Anatomy US : normal  Immunization History  Administered Date(s) Administered   Influenza,inj,Quad PF,6+ Mos 06/30/2015, 06/03/2017   Rho (D) Immune Globulin  12/12/2014   Tdap 12/08/2012, 06/30/2015, 06/03/2017, 04/11/2019     Prenatal Transfer Tool  Maternal Diabetes: No Genetic Screening: Normal Maternal Ultrasounds/Referrals: Normal Fetal Ultrasounds or other Referrals:  None Maternal Substance Abuse:  Yes:  Type: Smoker Significant Maternal Medications:  Meds include: Other:  Dupixent Significant Maternal Lab Results: Group B Strep negative and Rh negative Number of Prenatal Visits:greater than 3 verified prenatal visits Maternal Vaccinations: none Other Comments:  None   Results for orders placed or performed during the hospital encounter of 05/01/24 (from the past 24 hours)  Type and screen [facility]   Collection Time: 05/01/24 10:28 AM  Result Value Ref Range   ABO/RH(D) PENDING    Antibody Screen PENDING    Sample Expiration      05/04/2024,2359 Performed at  [facility], 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 [id number] with IUP at [redacted]w[redacted]d 04/24/2024, by Last Menstrual Period admitted for PD IOL.  Sono @ [redacted]w[redacted]d, CWD, normal anatomy, cephalic presentation, R lateral placenta, EFW 2423g, (24%)  #Labor: IOL #Pain: Per pt preference, desires WB #FWB: Cat II  #Rh negative #RH isoimmunization - O negative, antibody + - no RhoGAM - MCA dopplers nl. Unity cell free DNA test that predicts that the fetus does not carry the C or the D antigens and therefore should not be at risk for anemia.    #GBS status:  negative #Feeding: Breastmilk  #Reproductive Life planning: ?LARC vs BTS OP #Circ:  not applicable   Barabara Maier, DO FMOB Fellow, Faculty practice [facility], [facility] for [employer]

## 2024-05-02 ENCOUNTER — Other Ambulatory Visit: Payer: Self-pay

## 2024-05-02 ENCOUNTER — Encounter (HOSPITAL_COMMUNITY): Payer: Self-pay | Admitting: Obstetrics & Gynecology

## 2024-05-02 DIAGNOSIS — Z3A41 41 weeks gestation of pregnancy: Secondary | ICD-10-CM | POA: Diagnosis not present

## 2024-05-02 LAB — CBC
HCT: 32.1 % — ABNORMAL LOW (ref 36.0–46.0)
Hemoglobin: 10.9 g/dL — ABNORMAL LOW (ref 12.0–15.0)
MCH: 32.2 pg (ref 26.0–34.0)
MCHC: 34 g/dL (ref 30.0–36.0)
MCV: 94.7 fL (ref 80.0–100.0)
Platelets: 273 K/uL (ref 150–400)
RBC: 3.39 MIL/uL — ABNORMAL LOW (ref 3.87–5.11)
RDW: 14.4 % (ref 11.5–15.5)
WBC: 20.5 K/uL — ABNORMAL HIGH (ref 4.0–10.5)
nRBC: 0 % (ref 0.0–0.2)

## 2024-05-02 MED ORDER — ACETAMINOPHEN 325 MG PO TABS: 650.0000 mg | ORAL_TABLET | ORAL | Status: DC | PRN

## 2024-05-02 MED ORDER — CEFAZOLIN SODIUM-DEXTROSE 2-4 GM/100ML-% IV SOLN
2.0000 g | Freq: Three times a day (TID) | INTRAVENOUS | Status: AC
  Administered 2024-05-02 (×2): 2 g via INTRAVENOUS
  Filled 2024-05-02 (×4): qty 100

## 2024-05-02 MED ORDER — COCONUT OIL OIL: 1.0000 | TOPICAL_OIL | Status: DC | PRN

## 2024-05-02 MED ORDER — NITROGLYCERIN IN D5W 200-5 MCG/ML-% IV SOLN
INTRAVENOUS | Status: AC
  Filled 2024-05-02: qty 250

## 2024-05-02 MED ORDER — TRANEXAMIC ACID-NACL 1000-0.7 MG/100ML-% IV SOLN
INTRAVENOUS | Status: AC
  Administered 2024-05-02: 1000 mg via INTRAVENOUS
  Filled 2024-05-02: qty 100

## 2024-05-02 MED ORDER — ONDANSETRON HCL 4 MG PO TABS: 4.0000 mg | ORAL_TABLET | ORAL | Status: DC | PRN

## 2024-05-02 MED ORDER — IBUPROFEN 600 MG PO TABS
600.0000 mg | ORAL_TABLET | Freq: Four times a day (QID) | ORAL | Status: DC
  Administered 2024-05-02 – 2024-05-03 (×5): 600 mg via ORAL
  Filled 2024-05-02 (×6): qty 1

## 2024-05-02 MED ORDER — DIBUCAINE (PERIANAL) 1 % EX OINT: 1.0000 | TOPICAL_OINTMENT | CUTANEOUS | Status: DC | PRN

## 2024-05-02 MED ORDER — FENTANYL CITRATE (PF) 100 MCG/2ML IJ SOLN
INTRAMUSCULAR | Status: AC
Start: 2024-05-02 — End: 2024-05-02
  Filled 2024-05-02: qty 2

## 2024-05-02 MED ORDER — VALACYCLOVIR HCL 500 MG PO TABS
1000.0000 mg | ORAL_TABLET | Freq: Two times a day (BID) | ORAL | Status: DC
  Administered 2024-05-02 – 2024-05-03 (×3): 1000 mg via ORAL
  Filled 2024-05-02 (×3): qty 2

## 2024-05-02 MED ORDER — PRENATAL MULTIVITAMIN CH
1.0000 | ORAL_TABLET | Freq: Every day | ORAL | Status: DC
  Administered 2024-05-02 – 2024-05-03 (×2): 1 via ORAL
  Filled 2024-05-02: qty 1

## 2024-05-02 MED ORDER — SENNOSIDES-DOCUSATE SODIUM 8.6-50 MG PO TABS
2.0000 | ORAL_TABLET | Freq: Every day | ORAL | Status: DC
  Administered 2024-05-03: 2 via ORAL
  Filled 2024-05-02: qty 2

## 2024-05-02 MED ORDER — TRANEXAMIC ACID-NACL 1000-0.7 MG/100ML-% IV SOLN: 1000.0000 mg | INTRAVENOUS | Status: AC

## 2024-05-02 MED ORDER — ZOLPIDEM TARTRATE 5 MG PO TABS: 5.0000 mg | ORAL_TABLET | Freq: Every evening | ORAL | Status: DC | PRN

## 2024-05-02 MED ORDER — SIMETHICONE 80 MG PO CHEW: 80.0000 mg | CHEWABLE_TABLET | ORAL | Status: DC | PRN

## 2024-05-02 MED ORDER — SODIUM CHLORIDE 0.9 % IV SOLN: INTRAVENOUS | Status: AC | PRN

## 2024-05-02 MED ORDER — ONDANSETRON HCL 4 MG/2ML IJ SOLN: 4.0000 mg | INTRAMUSCULAR | Status: DC | PRN

## 2024-05-02 MED ORDER — TETANUS-DIPHTH-ACELL PERTUSSIS 5-2.5-18.5 LF-MCG/0.5 IM SUSY
0.5000 mL | PREFILLED_SYRINGE | Freq: Once | INTRAMUSCULAR | Status: DC
  Filled 2024-05-02: qty 0.5

## 2024-05-02 MED ORDER — DIPHENHYDRAMINE HCL 25 MG PO CAPS: 25.0000 mg | ORAL_CAPSULE | Freq: Four times a day (QID) | ORAL | Status: DC | PRN

## 2024-05-02 MED ORDER — ALBUTEROL SULFATE (2.5 MG/3ML) 0.083% IN NEBU: 3.0000 mL | INHALATION_SOLUTION | Freq: Four times a day (QID) | RESPIRATORY_TRACT | Status: DC | PRN

## 2024-05-02 MED ORDER — BENZOCAINE-MENTHOL 20-0.5 % EX AERO: 1.0000 | INHALATION_SPRAY | CUTANEOUS | Status: DC | PRN

## 2024-05-02 MED ORDER — FENTANYL CITRATE (PF) 100 MCG/2ML IJ SOLN
100.0000 ug | Freq: Once | INTRAMUSCULAR | Status: AC
  Administered 2024-05-02: 100 ug via INTRAVENOUS

## 2024-05-02 MED ORDER — CEFAZOLIN SODIUM-DEXTROSE 2-4 GM/100ML-% IV SOLN
2.0000 g | Freq: Three times a day (TID) | INTRAVENOUS | Status: DC
  Administered 2024-05-02: 2 g via INTRAVENOUS
  Filled 2024-05-02 (×2): qty 100

## 2024-05-02 MED ORDER — WITCH HAZEL-GLYCERIN EX PADS: 1.0000 | MEDICATED_PAD | CUTANEOUS | Status: DC | PRN

## 2024-05-02 NOTE — Lactation Note (Signed)
 This note was copied from a baby's chart. Lactation Consultation Note  Patient Name: Erin Tran Unijb'd Date: 05/02/2024 Age:30 hours Reason for consult: Initial assessment;TermSee MOB: MR -smokes 1.2 pack cigarettes daily , hx asthma.   P5, Per MOB infant is breastfeeding well, LC entered the room, MOB was changing soiled diaper, MOB latched infant on her left breast using the cradle hold with pillow support, infant latched with depth and was still breastfeeding when LC left the room. MOB will continue to breastfeed infant by cues, on demand, 8-12 times within 24 hours, skin to skin. MOB knows to call for latch assistance if needed. LC discussed the importance of maternal rest, meals and hydration. MOB was  made aware of O/P services, breastfeeding support groups, community resources, and our phone # for post-discharge questions.    Maternal Data Has patient been taught Hand Expression?: Yes Does the patient have breastfeeding experience prior to this delivery?: Yes How long did the patient breastfeed?: Per MOB, she breastfeed her 1st child for 6 months, 2nd -4 months, 3rd-3 months and 4 th for 4 monhts, child #4 is currently 68 years old.  Feeding Mother's Current Feeding Choice: Breast Milk  LATCH Score Latch: Grasps breast easily, tongue down, lips flanged, rhythmical sucking.  Audible Swallowing: A few with stimulation  Type of Nipple: Everted at rest and after stimulation  Comfort (Breast/Nipple): Soft / non-tender  Hold (Positioning): Assistance needed to correctly position infant at breast and maintain latch.  LATCH Score: 8   Lactation Tools Discussed/Used    Interventions Interventions: Breast feeding basics reviewed;Assisted with latch;Skin to skin;Breast compression;Adjust position;Hand express;Support pillows;Position options;Expressed milk;Education;Pace feeding;LC Services brochure;CDC milk storage guidelines;CDC Guidelines for Breast Pump Cleaning;NICU  Pumping Log  Discharge Pump: DEBP;Hands Free;Personal  Consult Status Consult Status: Follow-up Date: 05/03/24 Follow-up type: In-patient    Grayce LULLA Batter 05/02/2024, 5:09 PM

## 2024-05-02 NOTE — Progress Notes (Signed)
 OB/GYN Faculty Practice Delivery Note  Erin Tran is a 28 y.o. H2E6875 s/p SVB at 41wd. She was admitted for PDIOL.   ROM: 8h 56m with clear fluid GBS Status: negative Maximum Maternal Temperature: 98.3  Labor Progress: PD IOL with progression to complete cytotec  and AROM.  Delivery Date/Time: 05/01/2024 @ 2321 Delivery: Called to room and patient was complete and pushing in water. Head delivered OA to ROA. No nuchal cord present. Shoulder and body delivered in usual fashion. Infant with spontaneous cry, placed on mother's abdomen, dried and stimulated. Patient assisted back to bed for birth of placenta. Cord clamped x 2 after 5-minute delay, and cut by FOB Erin Tran. Cord blood drawn.  Labia, perineum, vagina, and cervix inspected, no laceration identified. Fundus firm with massage and Pitocin .  Placenta had not been born after 30 minutes, suspected bladder dystocia. Assisted patient to toilet to empty bladder, unable to void.Dr. Cleatus notified of prolonged placenta delivery time.  Assisted to back bed, fentanyl  given IV, I&O catheter with 75mL of urine noted. Again attempted to deliver placenta. Manual removal attempted with discovery that cervix dilated to approximately 5cm. Call to Dr. Cleatus who came to bedside, see her excellent note.   Placenta: manual, see Dr. Elfredia note Complications: manual extraction of placenta.  Lacerations: no laceration identified after inspection with sterile gauze EBL: 150 Analgesia: fentanyl  100mcg IV postpartum for manual removal of placenta.   Postpartum Planning [ X] transfer orders to MB Galerius.Gant ] discharge summary started & shared Galerius.Gant ] message to sent to schedule follow-up  Galerius.Gant ] lists updated  Infant: Erin Tran girl  APGARs 7/9  pending  Erin Rote, MSN, CNM, RNC-OB Certified Nurse Midwife, [facility] 05/02/2024 12:42 AM

## 2024-05-02 NOTE — Discharge Summary (Signed)
 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 yo H2E5874 at [redacted]w[redacted]d was admitted to the hospital 05/01/2024 for induction of labor.  Indication for induction: Postdates.  Patient had an labor course complicated by retained placenta Membrane Rupture Time/Date: 2:58 PM,05/01/2024  Delivery Method:Vaginal, Spontaneous Operative Delivery:N/Tran Episiotomy: None Lacerations:  None Details of delivery can be found in separate delivery note.  Patient had Tran postpartum course complicated by nothing. Patient is discharged home 05/03/24.  Newborn Data: Birth date:05/01/2024 Birth time:11:21 PM Gender:Female Living status:Living Apgars:7 ,9  Weight:3620 g  Magnesium Sulfate received: No BMZ received: No Rhophylac :Yes MMR:No T-DaP:Ordered postpartum Flu: No RSV Vaccine received: No Transfusion:No  Immunizations received: Immunization History  Administered Date(s) Administered   Influenza,inj,Quad PF,6+ Mos 06/30/2015, 06/03/2017   Rho (D) Immune Globulin  12/12/2014   Tdap 12/08/2012, 06/30/2015, 06/03/2017, 04/11/2019    Physical exam  Vitals:   05/02/24 1202 05/02/24 1650 05/02/24 2320 05/03/24 0531  BP: 133/70 128/62 (!) 118/56 106/63  Pulse:  74 68 72 62  Resp: 18 18 16 16   Temp: 98 F (36.7 C) 98.1 F (36.7 C) 98 F (36.7 C) 98 F (36.7 C)  TempSrc: Oral Oral Oral Oral  SpO2: 99% 98% 100% 100%  Weight:      Height:       General: alert, cooperative, and no distress Lochia: appropriate Uterine Fundus: firm Incision: N/Tran DVT Evaluation: No evidence of DVT seen on physical exam. Labs: Lab Results  Component Value Date   WBC 20.5 (H) 05/02/2024   HGB 10.9 (L) 05/02/2024   HCT 32.1 (L) 05/02/2024   MCV 94.7 05/02/2024   PLT 273 05/02/2024      Latest Ref Rng & Units 01/28/2024    9:03 AM  CMP  Glucose 70 - 99 mg/dL 80   BUN 6 - 20 mg/dL 8   Creatinine 9.42 - 8.99 mg/dL 9.37   Sodium 865 - 855 mmol/L 136   Potassium 3.5 - 5.2 mmol/L 4.0   Chloride 96 - 106 mmol/L 103   CO2 20 - 29 mmol/L 19   Calcium 8.7 - 10.2 mg/dL 9.1   Total Protein 6.0 - 8.5 g/dL 5.9   Total Bilirubin 0.0 - 1.2 mg/dL <9.7   Alkaline Phos 44 - 121 IU/L 94   AST 0 - 40 IU/L 12   ALT 0 - 32 IU/L 9    Edinburgh Score:    05/02/2024    8:30 AM  Edinburgh Postnatal Depression Scale Screening Tool  I have been able to laugh and see the funny side of things. 0  I  have looked forward with enjoyment to things. 0  I have blamed myself unnecessarily when things went wrong. 0  I have been anxious or worried for no good reason. 0  I have felt scared or panicky for no good reason. 0  Things have been getting on top of me. 1  I have been so unhappy that I have had difficulty sleeping. 0  I have felt sad or miserable. 0  I have been so unhappy that I have been crying. 0  The thought of harming myself has occurred to me. 0  Edinburgh Postnatal Depression Scale Total 1   Edinburgh Postnatal Depression Scale Total: 1   After visit meds:  Allergies as of 05/03/2024   No Known Allergies      Medication List     TAKE these medications    albuterol  108 (90 Base) MCG/ACT inhaler Commonly known as: VENTOLIN  HFA Inhale 2 puffs into the lungs  every 6 (six) hours as needed for wheezing or shortness of breath.   Blood Pressure Kit Devi 1 each by Does not apply route as needed.   DUPIXENT [place] Inject 1 Units into the skin every 14 (fourteen) days.   ibuprofen  600 MG tablet Commonly known as: ADVIL  Take 1 tablet (600 mg total) by mouth every 6 (six) hours.   prenatal multivitamin Tabs tablet Take 1 tablet by mouth daily at 12 noon.   senna-docusate 8.6-50 MG tablet Commonly known as: Senokot-S Take 2 tablets by mouth daily.   valACYclovir  1000 MG tablet Commonly known as: Valtrex  Take 1 tablet (1,000 mg total) by mouth 2 (two) times daily.         Discharge home in stable condition Infant Feeding: Bottle and Breast Infant Disposition:home with mother Discharge instruction: per After Visit Summary and Postpartum booklet. Activity: Advance as tolerated. Pelvic rest for 6 weeks.  Diet: routine diet Future Appointments:No future appointments. Follow up Visit:  Message sent to [facility] 05/02/24 by S.Warren-Hill, CNM Please schedule this patient for Tran In person postpartum visit in 6 weeks with the following provider: Any provider. Additional Postpartum F/U:NA  Low risk pregnancy complicated by: NA Delivery mode:  Vaginal, Spontaneous Anticipated Birth Control:  LARC vs. BTL   05/03/2024 [name], CNM

## 2024-05-02 NOTE — Progress Notes (Signed)
 Post Partum Day 1 s/p SVD c/b manual extraction of placenta Subjective: no complaints, up ad lib, voiding, and tolerating PO Currently doing newborn portraits  Objective: Blood pressure 130/72, pulse 78, temperature 98 F (36.7 C), temperature source Oral, resp. rate 20, height 5' 6 (1.676 m), weight 102.5 kg, last menstrual period 07/19/2023, SpO2 100%, unknown if currently breastfeeding.  Physical Exam:  General: alert, cooperative, and no distress Lochia: appropriate Uterine Fundus: deferred this AM, OOB for newborn portraits and no bleeding/RN concerns DVT Evaluation: No evidence of DVT seen on physical exam.  Recent Labs    05/01/24 1032 05/02/24 0441  HGB 11.3* 10.9*  HCT 33.2* 32.1*    Assessment/Plan: Postpartum Recovering appropriately - Contraception: Undecided, will readdress prior to discharge - MOF: breast - Rh status: Neg, infant Rh neg - Rubella status: Immune - Dispo: likely discharge home tomorrow  Neonatal - Female infant, doing well at bedside   LOS: 1 day   Kieth JAYSON Carolin 05/02/2024, 9:32 AM

## 2024-05-02 NOTE — Progress Notes (Signed)
 Called to evaluate patient. Retained placenta. Present in uterus for about 60 minutes. Pitocin  had been turned off by this point. Bladder emptied. Patient was given patient medication of fentanyl . Offered patient manual extraction at bedside versus OR removal. She preferred me to try at bedside. LUS was about 4-5 cm but I was able to get my hand beyond that point and then remove the placenta intact. Placenta was inspected following the removal and was noted to be intact with no areas of placental tissue missing. BSUS performed and thin EL noted with no color doppler flow. Bleeding was minimal.   TXA given and pitocin  started.  Will do Ancef  given the extent of my manipulation. Will do x3 doses.   See CNM note for delivery details otherwise.   Vina Solian, MD Attending Obstetrician & Gynecologist, [facility], [facility]

## 2024-05-03 ENCOUNTER — Encounter (HOSPITAL_COMMUNITY): Payer: Self-pay | Admitting: Obstetrics & Gynecology

## 2024-05-03 MED ORDER — IBUPROFEN 600 MG PO TABS: 600.0000 mg | ORAL_TABLET | Freq: Four times a day (QID) | ORAL | 0 refills | Status: AC

## 2024-05-03 MED ORDER — SENNOSIDES-DOCUSATE SODIUM 8.6-50 MG PO TABS: 2.0000 | ORAL_TABLET | Freq: Every day | ORAL | 0 refills | Status: AC

## 2024-05-03 NOTE — Plan of Care (Signed)
 Problem: Education: Goal: Knowledge of General Education information will improve Description: Including pain rating scale, medication(s)/side effects and non-pharmacologic comfort measures 05/03/2024 0741 by Madison Rosina LABOR, LPN Outcome: Adequate for Discharge 05/03/2024 0705 by Madison Rosina LABOR, LPN Outcome: Progressing   Problem: Health Behavior/Discharge Planning: Goal: Ability to manage health-related needs will improve 05/03/2024 0741 by Madison Rosina LABOR, LPN Outcome: Adequate for Discharge 05/03/2024 0705 by Madison Rosina LABOR, LPN Outcome: Progressing   Problem: Clinical Measurements: Goal: Ability to maintain clinical measurements within normal limits will improve 05/03/2024 0741 by Madison Rosina LABOR, LPN Outcome: Adequate for Discharge 05/03/2024 0705 by Madison Rosina LABOR, LPN Outcome: Progressing Goal: Will remain free from infection 05/03/2024 0741 by Madison Rosina LABOR, LPN Outcome: Adequate for Discharge 05/03/2024 0705 by Madison Rosina LABOR, LPN Outcome: Progressing Goal: Diagnostic test results will improve 05/03/2024 0741 by Madison Rosina LABOR, LPN Outcome: Adequate for Discharge 05/03/2024 0705 by Madison Rosina LABOR, LPN Outcome: Progressing Goal: Respiratory complications will improve 05/03/2024 0741 by Madison Rosina LABOR, LPN Outcome: Adequate for Discharge 05/03/2024 0705 by Madison Rosina LABOR, LPN Outcome: Progressing Goal: Cardiovascular complication will be avoided 05/03/2024 0741 by Madison Rosina LABOR, LPN Outcome: Adequate for Discharge 05/03/2024 0705 by Madison Rosina LABOR, LPN Outcome: Progressing   Problem: Activity: Goal: Risk for activity intolerance will decrease 05/03/2024 0741 by Madison Rosina LABOR, LPN Outcome: Adequate for Discharge 05/03/2024 0705 by Madison Rosina LABOR, LPN Outcome: Progressing   Problem: Nutrition: Goal: Adequate nutrition will be maintained 05/03/2024 0741 by Madison Rosina LABOR, LPN Outcome: Adequate for Discharge 05/03/2024 0705 by Madison Rosina LABOR, LPN Outcome: Progressing   Problem:  Coping: Goal: Level of anxiety will decrease 05/03/2024 0741 by Madison Rosina LABOR, LPN Outcome: Adequate for Discharge 05/03/2024 0705 by Madison Rosina LABOR, LPN Outcome: Progressing   Problem: Elimination: Goal: Will not experience complications related to bowel motility 05/03/2024 0741 by Madison Rosina LABOR, LPN Outcome: Adequate for Discharge 05/03/2024 0705 by Madison Rosina LABOR, LPN Outcome: Progressing Goal: Will not experience complications related to urinary retention 05/03/2024 0741 by Madison Rosina LABOR, LPN Outcome: Adequate for Discharge 05/03/2024 0705 by Madison Rosina LABOR, LPN Outcome: Progressing   Problem: Pain Managment: Goal: General experience of comfort will improve and/or be controlled 05/03/2024 0741 by Madison Rosina LABOR, LPN Outcome: Adequate for Discharge 05/03/2024 0705 by Madison Rosina LABOR, LPN Outcome: Progressing   Problem: Safety: Goal: Ability to remain free from injury will improve 05/03/2024 0741 by Madison Rosina LABOR, LPN Outcome: Adequate for Discharge 05/03/2024 0705 by Madison Rosina LABOR, LPN Outcome: Progressing   Problem: Skin Integrity: Goal: Risk for impaired skin integrity will decrease 05/03/2024 0741 by Madison Rosina LABOR, LPN Outcome: Adequate for Discharge 05/03/2024 0705 by Madison Rosina LABOR, LPN Outcome: Progressing   Problem: Education: Goal: Knowledge of Childbirth will improve 05/03/2024 0741 by Madison Rosina LABOR, LPN Outcome: Adequate for Discharge 05/03/2024 0705 by Madison Rosina LABOR, LPN Outcome: Progressing Goal: Ability to make informed decisions regarding treatment and plan of care will improve 05/03/2024 0741 by Madison Rosina LABOR, LPN Outcome: Adequate for Discharge 05/03/2024 0705 by Madison Rosina LABOR, LPN Outcome: Progressing Goal: Ability to state and carry out methods to decrease the pain will improve 05/03/2024 0741 by Madison Rosina LABOR, LPN Outcome: Adequate for Discharge 05/03/2024 0705 by Madison Rosina LABOR, LPN Outcome: Progressing Goal: Individualized Educational Video(s) 05/03/2024 0741 by  Madison Rosina LABOR, LPN Outcome: Adequate for Discharge 05/03/2024 0705 by Madison Rosina LABOR, LPN Outcome: Progressing   Problem: Coping: Goal: Ability to verbalize  concerns and feelings about labor and delivery will improve 05/03/2024 0741 by Madison Rosina LABOR, LPN Outcome: Adequate for Discharge 05/03/2024 0705 by Madison Rosina LABOR, LPN Outcome: Progressing   Problem: Life Cycle: Goal: Ability to make normal progression through stages of labor will improve 05/03/2024 0741 by Madison Rosina LABOR, LPN Outcome: Adequate for Discharge 05/03/2024 0705 by Madison Rosina LABOR, LPN Outcome: Progressing Goal: Ability to effectively push during vaginal delivery will improve 05/03/2024 0741 by Madison Rosina LABOR, LPN Outcome: Adequate for Discharge 05/03/2024 0705 by Madison Rosina LABOR, LPN Outcome: Progressing   Problem: Role Relationship: Goal: Will demonstrate positive interactions with the child 05/03/2024 0741 by Madison Rosina LABOR, LPN Outcome: Adequate for Discharge 05/03/2024 0705 by Madison Rosina LABOR, LPN Outcome: Progressing   Problem: Safety: Goal: Risk of complications during labor and delivery will decrease 05/03/2024 0741 by Madison Rosina LABOR, LPN Outcome: Adequate for Discharge 05/03/2024 0705 by Madison Rosina LABOR, LPN Outcome: Progressing   Problem: Pain Management: Goal: Relief or control of pain from uterine contractions will improve 05/03/2024 0741 by Madison Rosina LABOR, LPN Outcome: Adequate for Discharge 05/03/2024 0705 by Madison Rosina LABOR, LPN Outcome: Progressing   Problem: Education: Goal: Knowledge of condition will improve 05/03/2024 0741 by Madison Rosina LABOR, LPN Outcome: Adequate for Discharge 05/03/2024 0705 by Madison Rosina LABOR, LPN Outcome: Progressing Goal: Individualized Educational Video(s) 05/03/2024 0741 by Madison Rosina LABOR, LPN Outcome: Adequate for Discharge 05/03/2024 0705 by Madison Rosina LABOR, LPN Outcome: Progressing Goal: Individualized Newborn Educational Video(s) 05/03/2024 0741 by Madison Rosina LABOR, LPN Outcome: Adequate  for Discharge 05/03/2024 0705 by Madison Rosina LABOR, LPN Outcome: Progressing   Problem: Activity: Goal: Will verbalize the importance of balancing activity with adequate rest periods 05/03/2024 0741 by Madison Rosina LABOR, LPN Outcome: Adequate for Discharge 05/03/2024 0705 by Madison Rosina LABOR, LPN Outcome: Progressing Goal: Ability to tolerate increased activity will improve 05/03/2024 0741 by Madison Rosina LABOR, LPN Outcome: Adequate for Discharge 05/03/2024 0705 by Madison Rosina LABOR, LPN Outcome: Progressing   Problem: Coping: Goal: Ability to identify and utilize available resources and services will improve 05/03/2024 0741 by Madison Rosina LABOR, LPN Outcome: Adequate for Discharge 05/03/2024 0705 by Madison Rosina LABOR, LPN Outcome: Progressing   Problem: Life Cycle: Goal: Chance of risk for complications during the postpartum period will decrease 05/03/2024 0741 by Madison Rosina LABOR, LPN Outcome: Adequate for Discharge 05/03/2024 0705 by Madison Rosina LABOR, LPN Outcome: Progressing   Problem: Role Relationship: Goal: Ability to demonstrate positive interaction with newborn will improve 05/03/2024 0741 by Madison Rosina LABOR, LPN Outcome: Adequate for Discharge 05/03/2024 0705 by Madison Rosina LABOR, LPN Outcome: Progressing   Problem: Skin Integrity: Goal: Demonstration of wound healing without infection will improve 05/03/2024 0741 by Madison Rosina LABOR, LPN Outcome: Adequate for Discharge 05/03/2024 0705 by Madison Rosina LABOR, LPN Outcome: Progressing

## 2024-05-03 NOTE — Discharge Instructions (Addendum)
 [email or web address] for more information on contraception options.   WHAT TO LOOK OUT FOR: Fever of 100.4 or above Mastitis: feels like flu and breasts hurt Infection: increased pain, swelling or redness Blood clots golf ball size or larger Postpartum depression Pre Eclampsia Weight gain more than 5lbs in a week Blurry vision or seeing spots Increased swelling  Stomach pain under right breast   Congratulations on your newest addition!

## 2024-05-03 NOTE — Patient Instructions (Signed)

## 2024-05-03 NOTE — Plan of Care (Signed)

## 2024-05-03 NOTE — Lactation Note (Signed)
 This note was copied from a baby's chart. Lactation Consultation Note  Patient Name: Erin Tran Unijb'd Date: 05/03/2024 Age:30 hours Reason for consult: Follow-up assessment;Maternal discharge  P5, 41 wks, @ 35 hrs of life. Discharge anticipated today. Mom experienced breast feeder. Per mom has soreness sometimes- on and off. Coconut oil provided encouraged after each feed for nipple care. Encouraged mom breasts respond faster/easier with each pregnancy. Hand pump provided with flange variety. Highlighted flanges size to nipple only and sizing is dynamic with breast changes.  Encouraged mom to keep working on big mouth latch with baby and use EBM or coconut oil after each feed. Discussed cluster feeding overnight/ early morning brings in our milk supply, shared expectations of milk coming in. Highlighted risk of engorgement. Discussed hand pump/express to soften breasts, motrin  as anti-inflammatory, and ice packs for 10-20 minutes post feed/pumping if still over-full is the best treatments for inflamed/engorged breasts.   Maternal Data Has patient been taught Hand Expression?: Yes Does the patient have breastfeeding experience prior to this delivery?: Yes How long did the patient breastfeed?: Per MOB, she breastfeed her 1st child for 6 months, 2nd -4 months, 3rd-3 months and 4 th for 4 monhts, child #4 is currently 70 years old.  Feeding Mother's Current Feeding Choice: Breast Milk   Lactation Tools Discussed/Used Tools: Pump;Flanges;Coconut oil Breast pump type: Manual Pump Education: Milk Storage;Setup, frequency, and cleaning  Interventions Interventions: Breast feeding basics reviewed;Hand express;Breast compression;Expressed milk;Coconut oil;Hand pump;Education;LC Services brochure;CDC milk storage guidelines  Discharge Discharge Education: Engorgement and breast care Pump: DEBP;Manual;Hands Free;Personal  Consult Status Consult Status: Complete Date:  05/03/24 Follow-up type: In-patient    [facility] 05/03/2024, 10:55 AM

## 2024-05-04 LAB — TYPE AND SCREEN
ABO/RH(D): O NEG
Antibody Screen: POSITIVE
Unit division: 0

## 2024-05-04 LAB — BPAM RBC
Blood Product Expiration Date: 202509202359
Blood Product Expiration Date: 202509212359
Unit Type and Rh: 9500
Unit Type and Rh: 9500

## 2024-05-13 ENCOUNTER — Telehealth (HOSPITAL_COMMUNITY): Payer: Self-pay | Admitting: *Deleted

## 2024-05-13 NOTE — Telephone Encounter (Signed)
 05/13/2024  Name: Erin Tran MRN: [id number] DOB: 05-22-1996  Reason for Call:  Transition of Care Hospital Discharge Call  Contact Status: Patient Contact Status: Complete  Language assistant needed: Interpreter Mode: Interpreter Not Needed        Follow-Up Questions: Do You Have Any Concerns About Your Health As You Heal From Delivery?: No Do You Have Any Concerns About Your Infants Health?: No  Edinburgh Postnatal Depression Scale:  In the Past 7 Days: I have been able to laugh and see the funny side of things.: As much as I always could I have looked forward with enjoyment to things.: As much as I ever did I have blamed myself unnecessarily when things went wrong.: No, never I have been anxious or worried for no good reason.: No, not at all I have felt scared or panicky for no good reason.: No, not at all Things have been getting on top of me.: No, I have been coping as well as ever I have been so unhappy that I have had difficulty sleeping.: Not at all I have felt sad or miserable.: No, not at all I have been so unhappy that I have been crying.: No, never The thought of harming myself has occurred to me.: Never [place] Postnatal Depression Scale Total: 0  PHQ2-9 Depression Scale:     Discharge Follow-up: Edinburgh score requires follow up?: No Patient was advised of the following resources:: Support Group, Breastfeeding Support Group (declines postpartum group information via email)  Post-discharge interventions: Reviewed Newborn Safe Sleep Practices  Mliss Sieve, RN 05/13/2024 13:46

## 2024-05-25 ENCOUNTER — Encounter: Payer: Self-pay | Admitting: *Deleted

## 2024-06-08 ENCOUNTER — Ambulatory Visit (INDEPENDENT_AMBULATORY_CARE_PROVIDER_SITE_OTHER): Admitting: Physician Assistant

## 2024-06-08 ENCOUNTER — Other Ambulatory Visit: Payer: Self-pay

## 2024-06-08 ENCOUNTER — Encounter: Payer: Self-pay | Admitting: Physician Assistant

## 2024-06-08 NOTE — Progress Notes (Signed)
 Post Partum Visit Note  Erin Tran is a 28 y.o. [phone] female who presents for a postpartum visit. She is 5.3 weeks postpartum following a normal spontaneous vaginal delivery.  I have fully reviewed the prenatal and intrapartum course. The delivery was at 41 gestational weeks.  Anesthesia: none. Postpartum course has been good. Baby is doing well. Baby is feeding by breast. Bleeding no bleeding. Bowel function is normal. Bladder function is normal. Patient is not sexually active. Contraception method is none. Postpartum depression screening: negative.   The pregnancy intention screening data noted above was reviewed. Potential methods of contraception were discussed. The patient elected to proceed with No data recorded.   Edinburgh Postnatal Depression Scale - 06/08/24 1539       Edinburgh Postnatal Depression Scale:  In the Past 7 Days   I have been able to laugh and see the funny side of things. 0    I have looked forward with enjoyment to things. 0    I have blamed myself unnecessarily when things went wrong. 0    I have been anxious or worried for no good reason. 0    I have felt scared or panicky for no good reason. 0    Things have been getting on top of me. 1    I have been so unhappy that I have had difficulty sleeping. 0    I have felt sad or miserable. 0    I have been so unhappy that I have been crying. 0    The thought of harming myself has occurred to me. 0    Edinburgh Postnatal Depression Scale Total 1          Health Maintenance Due  Topic Date Due   Pneumococcal Vaccine (1 of 2 - PCV) Never done   Hepatitis B Vaccines 19-59 Average Risk (1 of 3 - 19+ 3-dose series) Never done   HPV VACCINES (1 - 3-dose SCDM series) Never done   Influenza Vaccine  03/26/2024   COVID-19 Vaccine (1 - 2025-26 season) Never done    The following portions of the patient's history were reviewed and updated as appropriate: allergies, current medications, past family history,  past medical history, past social history, past surgical history, and problem list.  Review of Systems Pertinent items are noted in HPI.  Objective:  BP 104/70   Pulse 96   Wt 224 lb 12.8 oz (102 kg)   LMP 07/19/2023 (Approximate)   Breastfeeding Yes   BMI 36.28 kg/m    General:  alert, cooperative, and appears stated age   Breasts:  normal  Lungs: clear to auscultation bilaterally  Heart:  regular rate and rhythm, S1, S2 normal, no murmur, click, rub or gallop  Abdomen: soft, non-tender; bowel sounds normal; no masses,  no organomegaly   Wound N/A  GU exam:  not indicated       Assessment:   1. Postpartum exam (Primary) -Normal postpartum exam.  -Smoking cessation with PCP  Plan:   Essential components of care per ACOG recommendations:  1.  Mood and well being: Patient with negative depression screening today. Reviewed local resources for support.  - hx of drug use? No.    2. Infant care and feeding:  -Patient currently breastmilk feeding?  -Social determinants of health (SDOH) reviewed in EPIC.The following needs were identified: smoking cessation  3. Sexuality, contraception and birth spacing - Patient does not want additional pregnancies.  - Reviewed reproductive life planning. Reviewed contraceptive methods  based on pt preferences and effectiveness.  Patient desired no contraception today.   - Discussed birth spacing of 18 months  4. Sleep and fatigue -Encouraged family/partner/community support of 4 hrs of uninterrupted sleep to help with mood and fatigue  5. Physical Recovery  - Discussed patients delivery and complications. She describes her labor as good. - Patient had a vaginal delivery with complications including manual removal of placenta. Patient had a no laceration.  - Patient has urinary incontinence? No. - Patient is not safe to resume physical and sexual activity  6.  Health Maintenance - HM due items addressed Yes - Last pap smear  Diagnosis   Date Value Ref Range Status  10/13/2023   Final   - Negative for intraepithelial lesion or malignancy (NILM)   Pap smear not indicated at today's visit.  -Breast Cancer screening indicated? No.   7. Chronic Disease/Pregnancy Condition follow up: smoking cessation with PCP  - PCP follow up  Jorene FORBES Moats, PA-C [facility] [employer]

## 2024-08-26 ENCOUNTER — Ambulatory Visit
Admission: EM | Admit: 2024-08-26 | Discharge: 2024-08-26 | Disposition: A | Discharge status: Home / Self Care | Source: Home / Self Care

## 2024-08-26 ENCOUNTER — Ambulatory Visit (INDEPENDENT_AMBULATORY_CARE_PROVIDER_SITE_OTHER)

## 2024-08-26 DIAGNOSIS — J189 Pneumonia, unspecified organism: Secondary | ICD-10-CM

## 2024-08-26 DIAGNOSIS — R0602 Shortness of breath: Secondary | ICD-10-CM

## 2024-08-26 DIAGNOSIS — R5383 Other fatigue: Secondary | ICD-10-CM

## 2024-08-26 DIAGNOSIS — R051 Acute cough: Secondary | ICD-10-CM

## 2024-08-26 DIAGNOSIS — J4521 Mild intermittent asthma with (acute) exacerbation: Secondary | ICD-10-CM

## 2024-08-26 MED ORDER — ALBUTEROL SULFATE HFA 108 (90 BASE) MCG/ACT IN AERS: 1.0000 | INHALATION_SPRAY | Freq: Four times a day (QID) | RESPIRATORY_TRACT | 0 refills | Status: AC | PRN

## 2024-08-26 MED ORDER — PREDNISONE 20 MG PO TABS: 20.0000 mg | ORAL_TABLET | Freq: Every day | ORAL | 0 refills | Status: AC

## 2024-08-26 MED ORDER — AMOXICILLIN-POT CLAVULANATE 875-125 MG PO TABS: 1.0000 | ORAL_TABLET | Freq: Two times a day (BID) | ORAL | 0 refills | Status: AC

## 2024-08-26 NOTE — Discharge Instructions (Addendum)
 Chest x-ray done today.  Final evaluation by the radiologist shows an area in the right middle lobe consistent with an infectious process.  This is likely an aspect of pneumonia.  The overall presentation is also consistent with an exacerbation of underlying asthma.  Given the physical exam findings, x-ray finding and symptoms we will treat with the following: Augmentin  875 mg twice daily for 7 days.  This is an antibiotic.  Take this with food.  Prednisone  20 mg once daily for 5 days. Take this in the morning.  This is a steroid to help with inflammation Albuterol  inhaler 1-2 puffs every 6 hours as needed for wheezing/shortness of breath.  May use Mucinex or Robitussin DM to help with congestion or cough.  Avoid Products containing pseudoephedrine Make sure to stay hydrated by drinking plenty of water. Return to urgent care or PCP if symptoms worsen or fail to resolve.

## 2024-08-26 NOTE — ED Provider Notes (Signed)
 " [facility]    CSN: [id number] Arrival date & time: 08/26/24  1446      History   Chief Complaint Chief Complaint  Patient presents with   Cough    HPI Erin Tran is a 29 y.o. female.   29 year old female who presents urgent care due to 10 to 14 days of cough, congestion, weakness, fatigue and shortness of breath.  She has been taking NyQuil without much relief.  She had a fever about 2 days ago.  The coughing has gotten much worse and goes from dry to productive.  She is having some shortness of breath with this as well.  She has had some wheezing.  She denies any nausea or vomiting but has not been eating as much as usual.  She has been around numerous people who have had different viral infections.  Her 95-month-old is also sick.   Cough Associated symptoms: shortness of breath and wheezing   Associated symptoms: no chest pain, no chills, no ear pain, no fever, no rash and no sore throat     Past Medical History:  Diagnosis Date   Asthma    Asthma, intermittent, uncomplicated 10/29/2014   Overview:   No meds     Blood type, Rh negative 06/28/2015   Patient has h/o alloimmunization   -2014 pregnancy she presented to care at [redacted]w[redacted]d in 06/2012 she was anti D (1:64) Anti C (1:4) positive. She has a history of a TAB in 2012 but reported receiving rhogam.      Patient has h/o alloimmunization   -2014 pregnancy she presented to care at [redacted]w[redacted]d in 06/2012 she was anti D (1:64) Anti C (1:4) positive. She has a history of a TAB in 2012 but reported rec   Full-term premature rupture of membranes 06/08/2017   HSV (herpes simplex virus) anogenital infection    oct. 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 08/11/2018   Morbid obesity (HCC) 01/14/2022   Other specified cough 02/06/2023    Pruritic dermatitis 01/14/2022   Retained placenta 05/02/2024   Manual extraction after 1 hour at bedside     Rh negative state in antepartum period 10/13/2023   Supervision of high risk pregnancy, antepartum 06/27/2015   Clinic [facility] Prenatal Labs    Dating 19 wk u/s Blood type: --/--/O NEG (10/14 1243)     Genetic Screen Too late Antibody:POS (10/14 1243)    Anatomic US  female Rubella:    GTT Third trimester:  RPR: Non Reactive (10/14 1243)     Flu vaccine 06/03/17 HBsAg: Negative (10/14 1438)     TDaP vaccine 06/03/17                                   Rhogam:06/03/17 HIV:       Baby Food Breast                         Vitamin D deficiency 01/14/2022    Patient Active Problem List   Diagnosis Date Noted   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 Authorizing Provider  albuterol  (VENTOLIN  HFA) 108 (90 Base) MCG/ACT inhaler Inhale 1-2 puffs into the lungs every 6 (six) hours as needed for wheezing or shortness of breath. 08/26/24  Yes Ormond Lazo A, PA-C  amoxicillin -clavulanate (AUGMENTIN ) 875-125 MG tablet Take 1 tablet by mouth every 12 (twelve) hours. 08/26/24  Yes Burnett Lieber A, PA-C  predniSONE  (DELTASONE ) 20 MG tablet Take 1 tablet (20 mg total) by mouth daily with breakfast for 5 days. 08/26/24 08/31/24 Yes Rupa Lagan A, PA-C  Blood Pressure Monitoring (BLOOD PRESSURE KIT) DEVI 1 each by Does not apply route as needed. Patient not taking: Reported on 04/28/2024 10/08/23   Delores Nidia CROME, FNP  Dupilumab  (DUPIXENT Agenda) Inject 1 Units into the skin every 14 (fourteen) days. Patient not taking: Reported on 06/08/2024    [provider]  ibuprofen  (ADVIL ) 600 MG tablet Take 1 tablet (600 mg total) by mouth every 6 (six) hours. Patient not taking: Reported on 06/08/2024 05/03/24   Regino Camie LABOR, CNM  Prenatal Vit-Fe Fumarate-FA (PRENATAL MULTIVITAMIN) TABS tablet Take 1 tablet by mouth daily at 12 noon. 08/10/18   Weinhold, Samantha C, CNM  senna-docusate (SENOKOT-S) 8.6-50 MG tablet Take 2 tablets by mouth daily. Patient not taking: Reported on 06/08/2024 05/03/24   Regino Camie LABOR, CNM  valACYclovir  (VALTREX ) 1000 MG tablet Take 1 tablet (1,000 mg total) by mouth 2 (two) times daily. Patient not taking: Reported on 06/08/2024 04/07/24   Loyola Fish, MD    Family History Family History  Problem Relation Age of Onset   Asthma Mother    Seizures Mother    Diabetes Maternal Grandmother    Cancer Neg Hx    Hypertension Neg Hx    Hyperlipidemia Neg Hx    Stroke Neg Hx    Heart disease Neg Hx     Social History Social History[1]   Allergies   Patient has no known allergies.   Review of Systems Review of Systems  Constitutional:  Positive for fatigue. Negative for chills and fever.  HENT:  Positive for congestion. Negative for ear pain and sore throat.   Eyes:  Negative for pain and visual disturbance.  Respiratory:  Positive for cough, shortness of breath and wheezing.   Cardiovascular:  Negative for chest pain and palpitations.  Gastrointestinal:  Negative for abdominal pain and vomiting.  Genitourinary:  Negative for dysuria and hematuria.  Musculoskeletal:  Negative for arthralgias and back pain.  Skin:  Negative for color change and rash.  Neurological:  Positive for weakness (Generalized). Negative for seizures and syncope.  All other systems reviewed and are negative.    Physical Exam Triage Vital Signs ED Triage Vitals [08/26/24 1708]  Encounter  Vitals Group     BP 112/74     Girls Systolic BP Percentile      Girls Diastolic BP Percentile  Boys Systolic BP Percentile      Boys Diastolic BP Percentile      Pulse Rate 98     Resp 16     Temp 98.4 F (36.9 C)     Temp Source Oral     SpO2 97 %     Weight      Height      Head Circumference      Peak Flow      Pain Score 0     Pain Loc      Pain Education      Exclude from Growth Chart    No data found.  Updated Vital Signs BP 112/74 (BP Location: Left Arm)   Pulse 98   Temp 98.4 F (36.9 C) (Oral)   Resp 16   LMP 07/27/2024 (Exact Date)   SpO2 97%   Visual Acuity Right Eye Distance:   Left Eye Distance:   Bilateral Distance:    Right Eye Near:   Left Eye Near:    Bilateral Near:     Physical Exam Vitals and nursing note reviewed.  Constitutional:      General: She is not in acute distress.    Appearance: She is well-developed.  HENT:     Head: Normocephalic and atraumatic.     Right Ear: Tympanic membrane normal.     Left Ear: Tympanic membrane normal.     Nose: Congestion present.     Mouth/Throat:     Mouth: Mucous membranes are moist.  Eyes:     Conjunctiva/sclera: Conjunctivae normal.  Cardiovascular:     Rate and Rhythm: Normal rate and regular rhythm.     Heart sounds: No murmur heard. Pulmonary:     Effort: Pulmonary effort is normal. No respiratory distress.     Breath sounds: Examination of the right-upper field reveals wheezing. Examination of the left-upper field reveals wheezing and rhonchi. Examination of the right-middle field reveals wheezing. Examination of the left-middle field reveals wheezing and rhonchi. Examination of the right-lower field reveals wheezing. Examination of the left-lower field reveals wheezing. Wheezing and rhonchi present.  Abdominal:     Palpations: Abdomen is soft.     Tenderness: There is no abdominal tenderness.  Musculoskeletal:        General: No swelling.     Cervical back: Neck supple.  Skin:     General: Skin is warm and dry.     Capillary Refill: Capillary refill takes less than 2 seconds.  Neurological:     Mental Status: She is alert.  Psychiatric:        Mood and Affect: Mood normal.      UC Treatments / Results  Labs (all labs ordered are listed, but only abnormal results are displayed) Labs Reviewed - No data to display  EKG   Radiology DG Chest 2 View Result Date: 08/26/2024 EXAM: 2 VIEW(S) XRAY OF THE CHEST 08/26/2024 05:53:24 PM COMPARISON: None available. CLINICAL HISTORY: Cough, fatigue, weakness, shortness of breath FINDINGS: LUNGS AND PLEURA: Streaky opacities in right middle lobe. No pleural effusion. No pneumothorax. HEART AND MEDIASTINUM: No acute abnormality of the cardiac and mediastinal silhouettes. BONES AND SOFT TISSUES: No acute osseous abnormality. IMPRESSION: 1. Streaky opacities in the right middle lobe. Electronically signed by: Morgane Naveau MD 08/26/2024 06:08 PM EST RP Workstation: [id number]    Procedures Procedures (including critical care time)  Medications Ordered in UC Medications - No data to display  Initial Impression / Assessment and Plan / UC Course  I have reviewed the triage vital signs and the nursing notes.  Pertinent labs & imaging results that were available during my care of the patient were reviewed by me and considered in my medical decision making (see chart for details).     Acute cough - Plan: DG Chest 2 View, DG Chest 2 View  Shortness of breath - Plan: DG Chest 2 View, DG Chest 2 View  Other fatigue - Plan: DG Chest 2 View, DG Chest 2 View  Mild intermittent asthma with exacerbation  Pneumonia of right middle lobe due to infectious organism   Chest x-ray done today.  Final evaluation by the radiologist shows an area in the right middle lobe consistent with an infectious process.  This is likely an aspect of pneumonia.  The overall presentation is also consistent with an exacerbation of underlying asthma.   Given the physical exam findings, x-ray finding and symptoms we will treat with the following: Augmentin  875 mg twice daily for 7 days.  This is an antibiotic.  Take this with food.  Prednisone  20 mg once daily for 5 days. Take this in the morning.  This is a steroid to help with inflammation Albuterol  inhaler 1-2 puffs every 6 hours as needed for wheezing/shortness of breath.  May use Mucinex or Robitussin DM to help with congestion or cough.  Avoid Products containing pseudoephedrine Make sure to stay hydrated by drinking plenty of water. Return to urgent care or PCP if symptoms worsen or fail to resolve.    Final Clinical Impressions(s) / UC Diagnoses   Final diagnoses:  Acute cough  Shortness of breath  Other fatigue  Mild intermittent asthma with exacerbation  Pneumonia of right middle lobe due to infectious organism     Discharge Instructions      Chest x-ray done today.  Final evaluation by the radiologist shows an area in the right middle lobe consistent with an infectious process.  This is likely an aspect of pneumonia.  The overall presentation is also consistent with an exacerbation of underlying asthma.  Given the physical exam findings, x-ray finding and symptoms we will treat with the following: Augmentin  875 mg twice daily for 7 days.  This is an antibiotic.  Take this with food.  Prednisone  20 mg once daily for 5 days. Take this in the morning.  This is a steroid to help with inflammation Albuterol  inhaler 1-2 puffs every 6 hours as needed for wheezing/shortness of breath.  May use Mucinex or Robitussin DM to help with congestion or cough.  Avoid Products containing pseudoephedrine Make sure to stay hydrated by drinking plenty of water. Return to urgent care or PCP if symptoms worsen or fail to resolve.       ED Prescriptions     Medication Sig Dispense Auth. Provider   amoxicillin -clavulanate (AUGMENTIN ) 875-125 MG tablet Take 1 tablet by mouth every 12 (twelve)  hours. 14 tablet Seth Friedlander A, PA-C   predniSONE  (DELTASONE ) 20 MG tablet Take 1 tablet (20 mg total) by mouth daily with breakfast for 5 days. 5 tablet Teresa Almarie LABOR, PA-C   albuterol  (VENTOLIN  HFA) 108 (90 Base) MCG/ACT inhaler Inhale 1-2 puffs into the lungs every 6 (six) hours as needed for wheezing or shortness of breath. 18 g Teresa Almarie LABOR, [place]      PDMP not reviewed this encounter.    [1]  Social History Tobacco Use   Smoking status: Every Day    Current packs/day: 0.50    Average packs/day:  0.5 packs/day for 6.0 years (3.0 ttl pk-yrs)    Types: Cigarettes    Start date: 2020   Smokeless tobacco: Never  Vaping Use   Vaping status: Never Used  Substance Use Topics   Alcohol use: No   Drug use: No     Teresa Almarie LABOR, PA-C 08/26/24 1824  "

## 2024-08-26 NOTE — ED Triage Notes (Signed)
 Pt present with c/o weakness, cough and runny nose x 2 weeks. Pt states she had a fever two days ago. Pt states she has taken NyQuil. Pt c/o the cough worsening, has a dry and productive cough.
